# Patient Record
Sex: Male | Born: 1960 | Race: White | Hispanic: No | Marital: Married | Smoking: Former smoker
Health system: Southern US, Community
[De-identification: ages and names within clinical notes are randomized; demographics above are authoritative.]

## PROBLEM LIST (undated history)

## (undated) DIAGNOSIS — C61 Malignant neoplasm of prostate: Secondary | ICD-10-CM

## (undated) DIAGNOSIS — E785 Hyperlipidemia, unspecified: Secondary | ICD-10-CM

## (undated) DIAGNOSIS — I10 Essential (primary) hypertension: Secondary | ICD-10-CM

## (undated) DIAGNOSIS — I2510 Atherosclerotic heart disease of native coronary artery without angina pectoris: Secondary | ICD-10-CM

## (undated) DIAGNOSIS — K219 Gastro-esophageal reflux disease without esophagitis: Secondary | ICD-10-CM

## (undated) HISTORY — PX: CARDIAC CATHETERIZATION: SHX172

## (undated) HISTORY — PX: PROSTATE BIOPSY: SHX241

## (undated) HISTORY — PX: LAPAROSCOPIC CHOLECYSTECTOMY: SUR755

---

## 2007-03-28 ENCOUNTER — Inpatient Hospital Stay (HOSPITAL_COMMUNITY): Admission: AD | Admit: 2007-03-28 | Discharge: 2007-03-30 | Payer: Self-pay | Admitting: Cardiology

## 2007-03-28 ENCOUNTER — Encounter: Payer: Self-pay | Admitting: Emergency Medicine

## 2007-03-29 ENCOUNTER — Encounter (INDEPENDENT_AMBULATORY_CARE_PROVIDER_SITE_OTHER): Payer: Self-pay | Admitting: Cardiology

## 2007-04-06 ENCOUNTER — Ambulatory Visit: Payer: Self-pay | Admitting: Cardiology

## 2007-04-06 ENCOUNTER — Emergency Department (HOSPITAL_COMMUNITY): Admission: EM | Admit: 2007-04-06 | Discharge: 2007-04-06 | Payer: Self-pay | Admitting: Emergency Medicine

## 2007-06-26 ENCOUNTER — Encounter: Admission: RE | Admit: 2007-06-26 | Discharge: 2007-06-26 | Payer: Self-pay | Admitting: Gastroenterology

## 2007-10-03 ENCOUNTER — Ambulatory Visit (HOSPITAL_COMMUNITY): Admission: RE | Admit: 2007-10-03 | Discharge: 2007-10-04 | Payer: Self-pay | Admitting: General Surgery

## 2007-10-03 ENCOUNTER — Encounter (INDEPENDENT_AMBULATORY_CARE_PROVIDER_SITE_OTHER): Payer: Self-pay | Admitting: General Surgery

## 2008-05-20 ENCOUNTER — Emergency Department (HOSPITAL_COMMUNITY): Admission: EM | Admit: 2008-05-20 | Discharge: 2008-05-20 | Payer: Self-pay | Admitting: *Deleted

## 2009-03-10 IMAGING — US US ABDOMEN COMPLETE
1 series · 13 of 25 positions shown · non-contrast
Comparison: none

CLINICAL DATA: Abdominal pain and nausea.  Chest pain. 
 ABDOMEN ULTRASOUND:
TECHNIQUE: Complete abdominal ultrasound examination was performed including evaluation of the liver, gallbladder, bile ducts, pancreas, kidneys, spleen, IVC, and abdominal aorta.

[Series 1: us abdomen complete · 0.29mm/px · 13 of 99 slices shown]
[im 1/99]
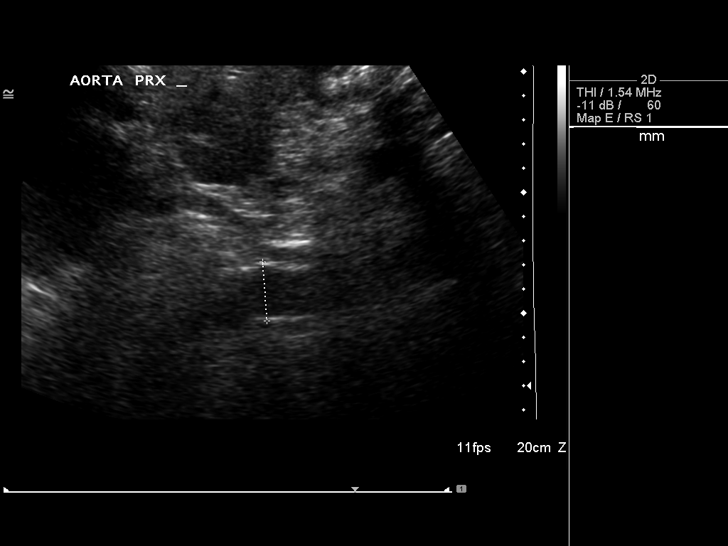
[im 9/99]
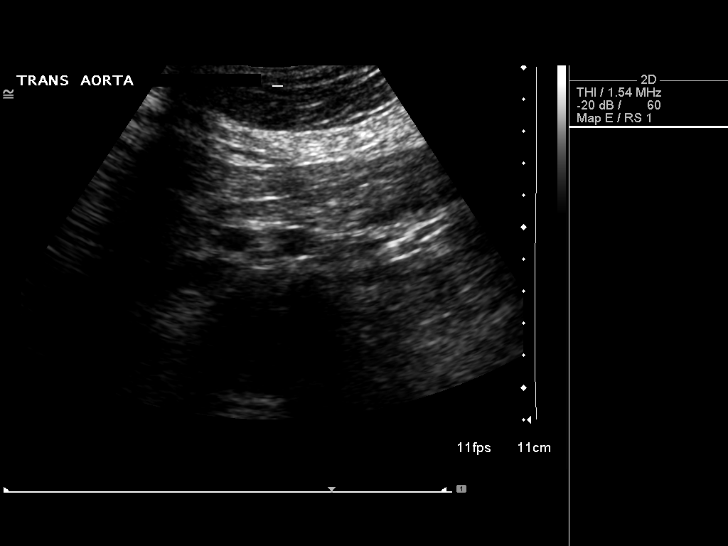
[im 17/99]
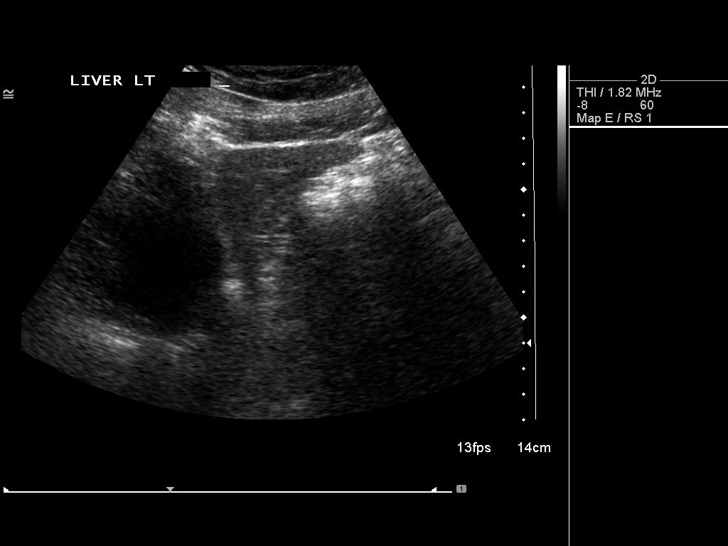
[im 25/99]
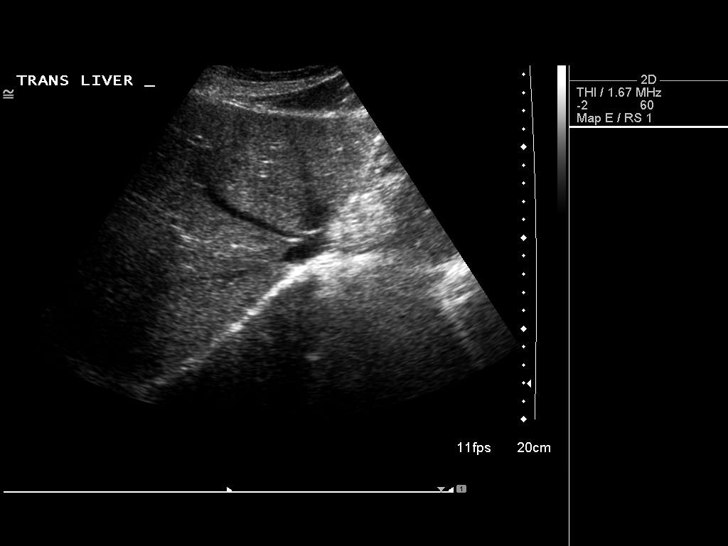
[im 33/99]
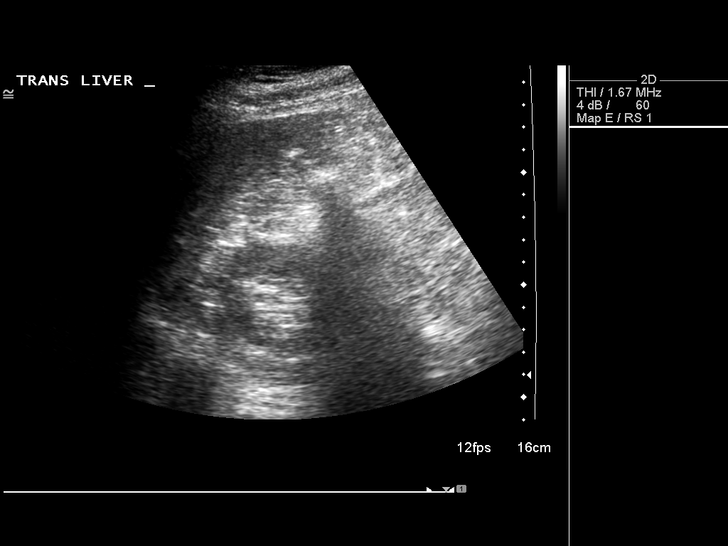
[im 41/99]
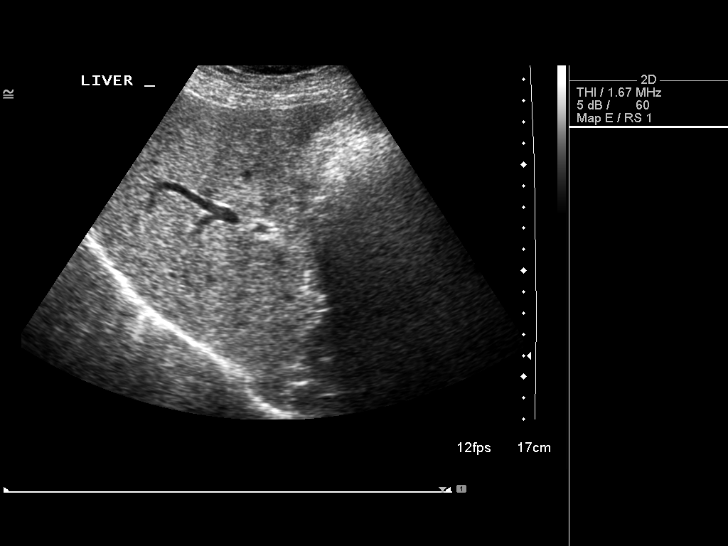
[im 50/99]
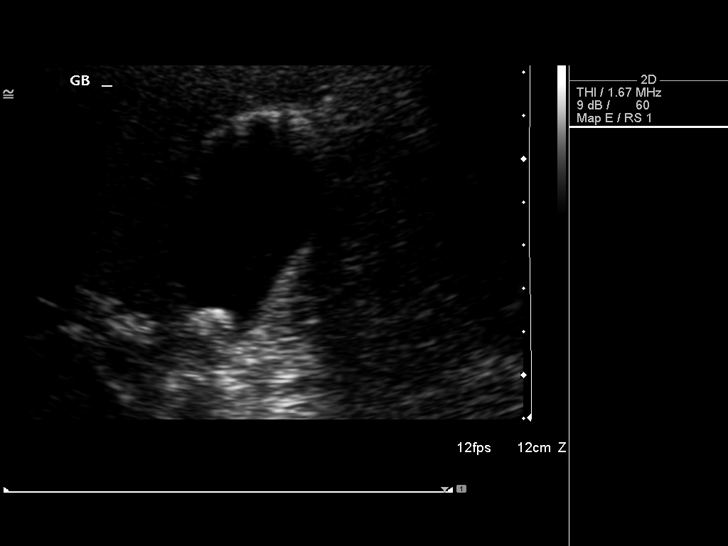
[im 58/99]
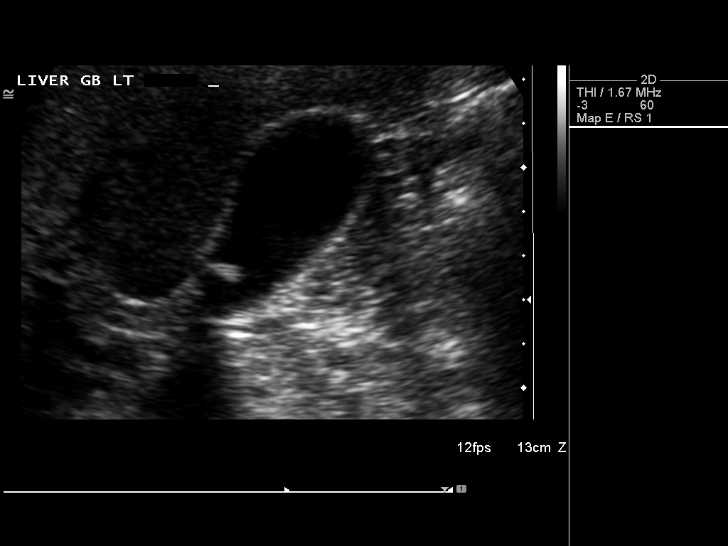
[im 66/99]
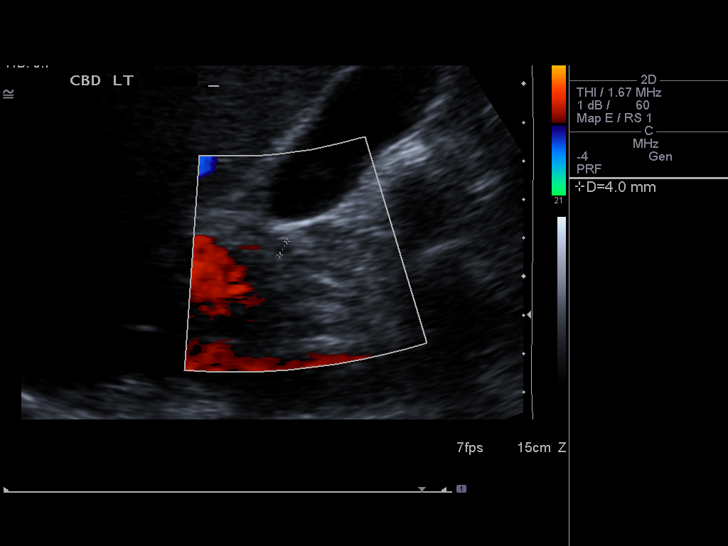
[im 74/99]
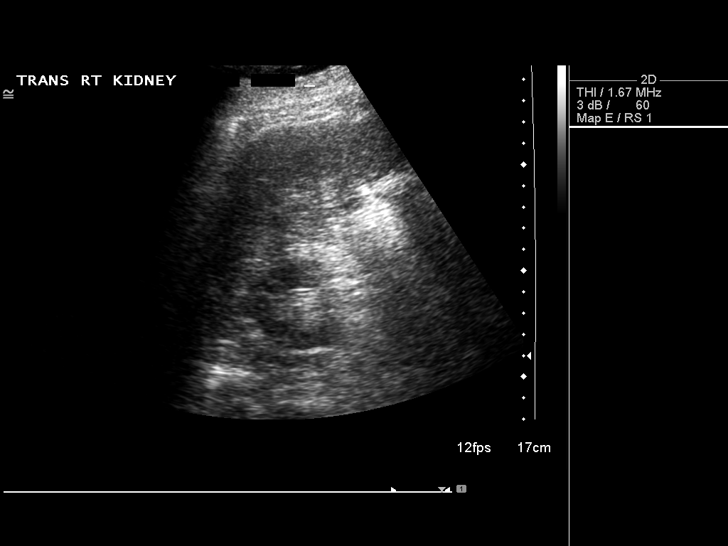
[im 82/99]
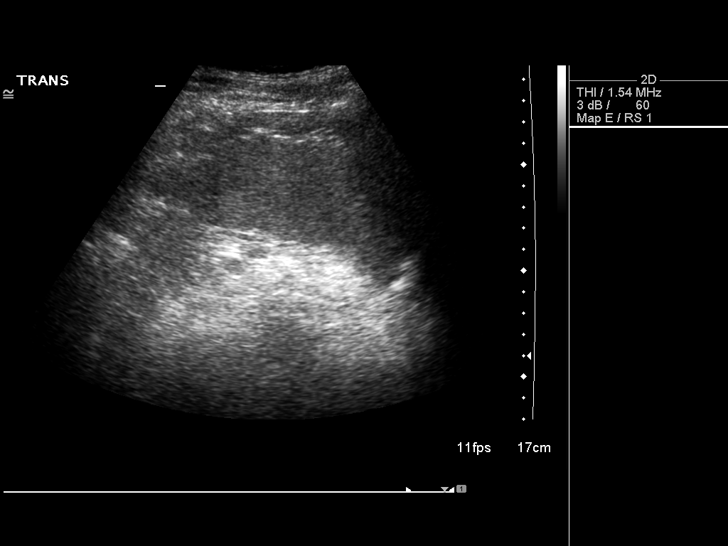
[im 90/99]
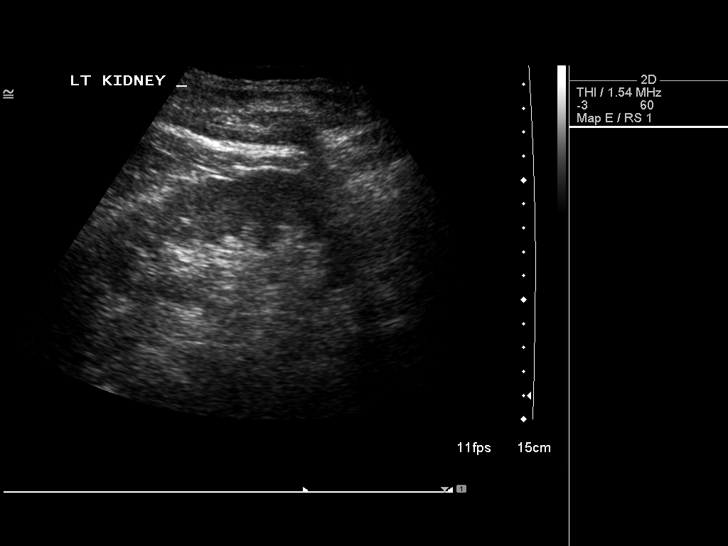
[im 99/99]
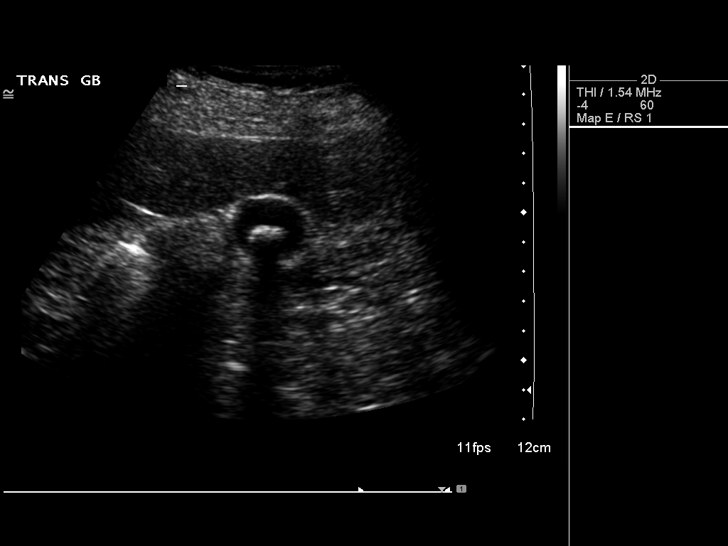

[13 of 25 positions shown; findings below may reference images not displayed]

FINDINGS: There is a single mobile 13 mm stone in the gallbladder as well as what appear to be two tiny gallbladder polyps.  There is also some increased echogenicity from the fundus consistent with adenomyomatosis.  The common bile duct is normal at 4 mm in diameter.  No dilated intrahepatic bile ducts.  There is diffuse slight increased echogenicity of the liver parenchyma consistent with fatty infiltration.  The inferior vena cava, spleen, kidneys, and aorta are normal.  The head and body of the pancreas are normal.  The tail of the pancreas is obscured by bowel gas.  Right kidney is 11.7 cm in length and left kidney is 10.8 cm in length.  The spleen is 12.7 cm in maximum diameter.  The maximum diameter of the abdominal aorta is 2.5 cm.
IMPRESSION: 1.  Single, mobile gallstone.  Two small gallbladder polyps as well as adenomyomatosis of the fundus of the gallbladder. 
 2.  Fatty liver.  
 3.  Nonvisualization of the tail of the pancreas.
 4.  Otherwise normal exam.

## 2009-06-17 IMAGING — RF DG CHOLANGIOGRAM OPERATIVE
1 series · 4 of 4 positions shown · non-contrast
Comparison: None

CLINICAL DATA: Gallstones

INTRAOPERATIVE CHOLANGIOGRAM
TECHNIQUE: Multiple fluoroscopic spot radiographs were obtained
during intraoperative cholangiogram and are submitted for
interpretation post-operatively.

[Series 1: run · 4 of 32 frames shown]
[frame 5/32]
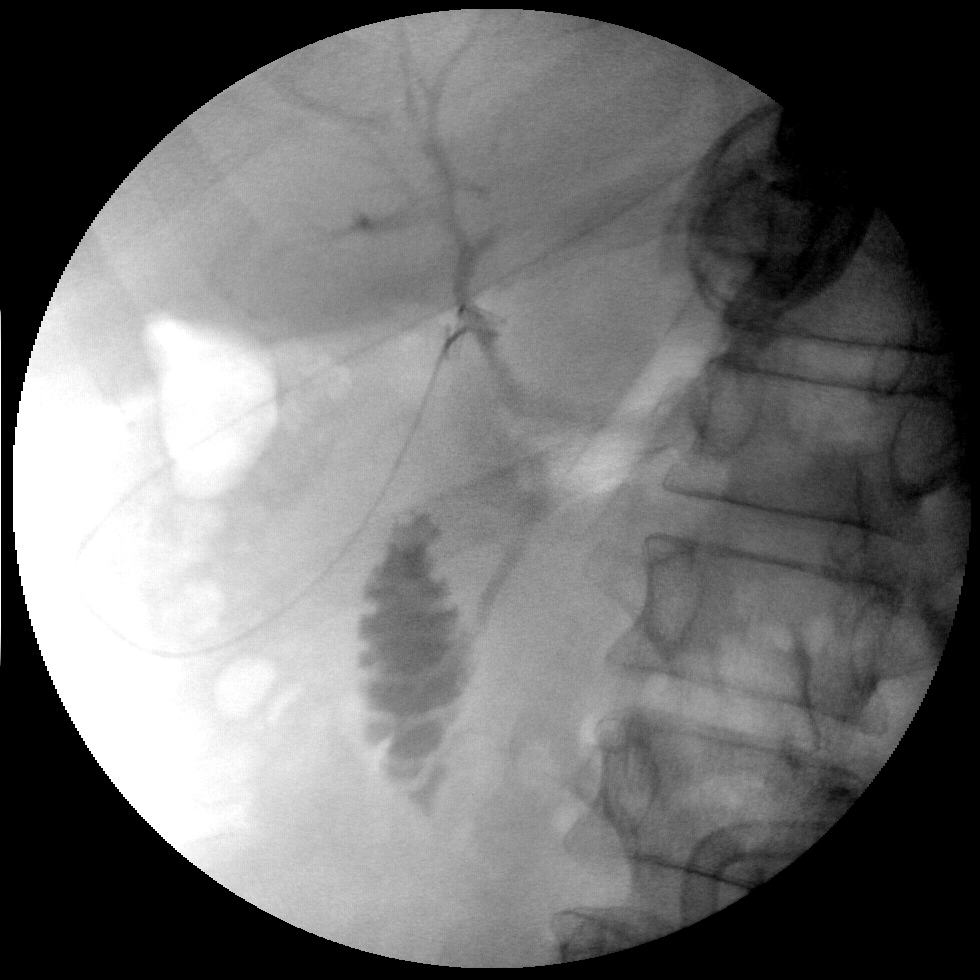
[frame 13/32]
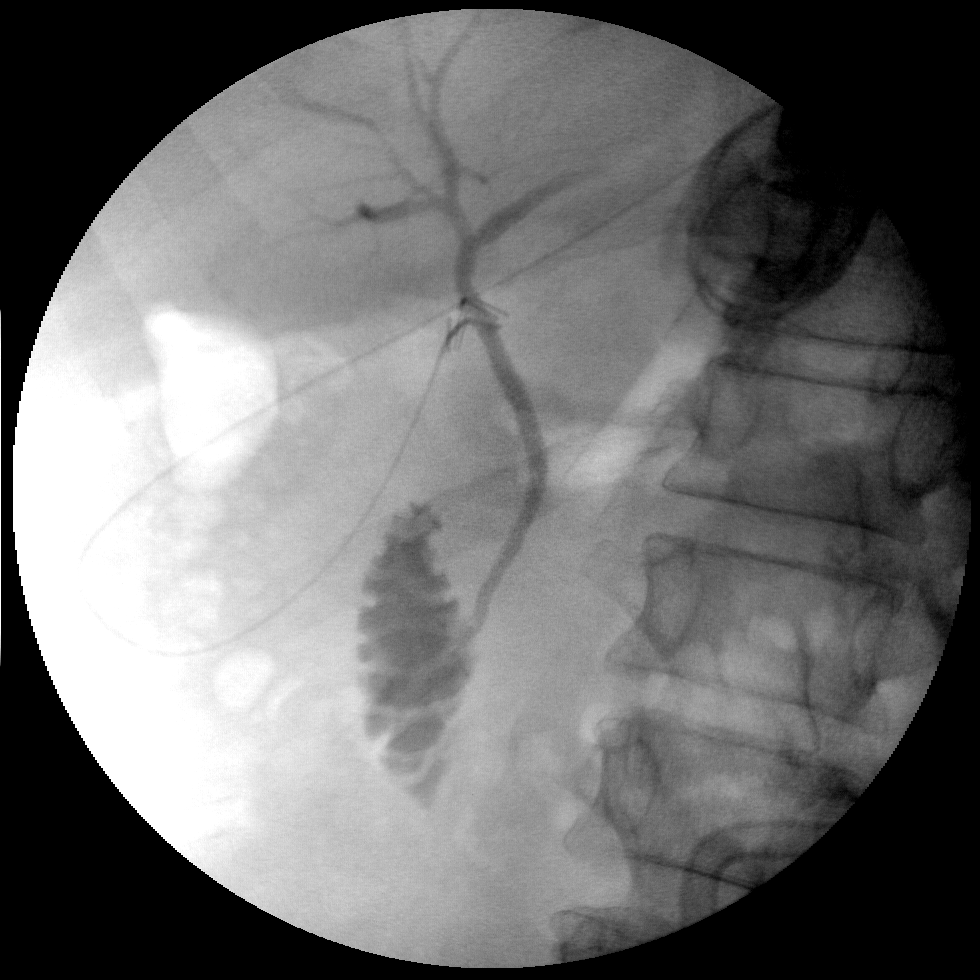
[frame 17/32]
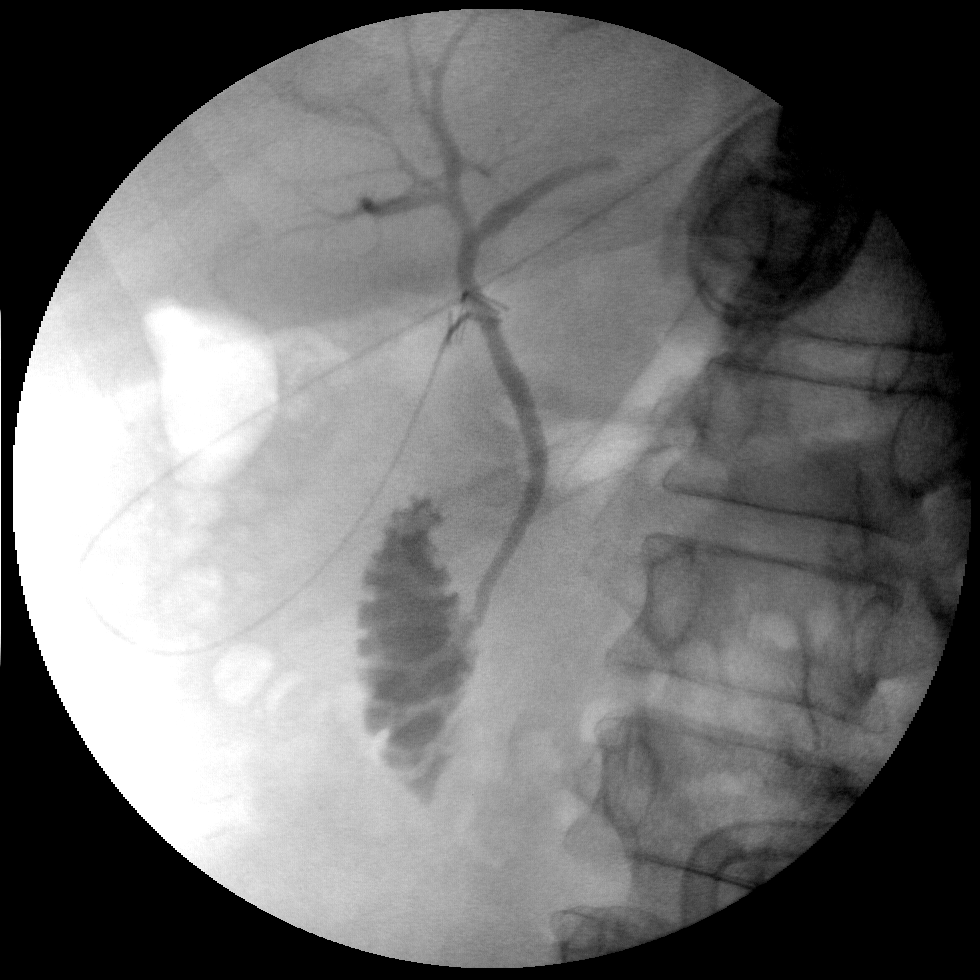
[frame 28/32]
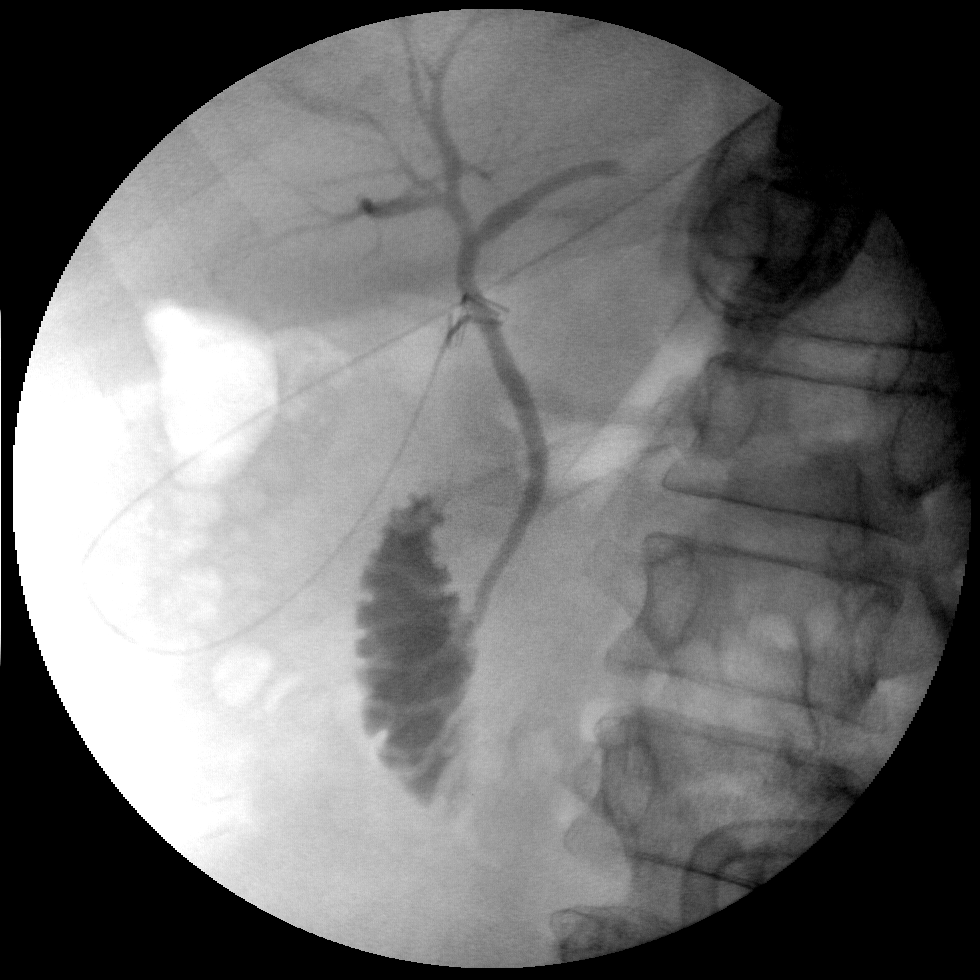

[4 of 4 positions shown; findings below may reference images not displayed]

FINDINGS: The biliary tree is well distended with contrast.  There
is no evidence of a filling defect or retained calculus.  There is
flow of contrast into the duodenum with no evidence of obstruction.
IMPRESSION: Normal examination.  No evidence of retained calculus or
obstruction.

## 2010-08-29 LAB — POCT I-STAT, CHEM 8
Glucose, Bld: 102 mg/dL — ABNORMAL HIGH (ref 70–99)
TCO2: 28 mmol/L (ref 0–100)

## 2010-08-29 LAB — POCT CARDIAC MARKERS
CKMB, poc: <1 ng/mL — ABNORMAL LOW (ref 1.0–8.0)
Myoglobin, poc: 51.2 ng/mL (ref 12–200)

## 2010-08-29 LAB — D-DIMER, QUANTITATIVE: D-Dimer, Quant: <0.22 ug/mL-FEU (ref 0.00–0.48)

## 2010-09-27 NOTE — Cardiovascular Report (Signed)
NAMELIEF, PALMATIER                ACCOUNT NO.:  [id number]   MEDICAL RECORD NO.:  [id number]          PATIENT TYPE:  INP   LOCATION:  6527                         FACILITY:  [facility]   PHYSICIAN:  Francisca December, M.D.  DATE OF BIRTH:  Nov 10, 1960   DATE OF PROCEDURE:  03/29/2007  DATE OF DISCHARGE:  03/30/2007                            CARDIAC CATHETERIZATION   PROCEDURES PERFORMED:  1. Left heart catheterization.  2. Coronary angiography.  3. Left ventriculogram.   INDICATIONS:  [name] is a 50 year old man who yesterday  developed unstable anginal chest pain syndrome.  He presented to [facility] with chest discomfort and ST-segment depression with T-  wave inversion in the inferior and lateral leads.  Once his discomfort  had resolved after two sublingual nitroglycerin, the EKG changes  resolved as well.  He did have some symptoms of flushing and  lightheadedness as well as the onset of chest discomfort with eating.  This recurred this morning at breakfast.  He has ruled out for  myocardial infarction by cardiac enzymes.  The ECG from earlier today is  not in the chart.  He has been treated with subcutaneous Lovenox,  aspirin and Integrilin.  He is brought to catheterization laboratory at  this time to identify the extent of disease with the diagnosis of  unstable angina pectoris and provide for further therapeutic options.   PROCEDURAL NOTE:  The patient brought to cardiac catheterization  laboratory in the fasting state.  The right groin was prepped and draped  in the usual sterile fashion.  Local anesthesia was obtained with  infiltration of 1% lidocaine.  A 6-French catheter sheath was inserted  percutaneously into the right femoral artery utilizing an anterior  approach over a guiding J-wire.  Left heart catheterization and coronary  angiography then proceed in the standard fashion using 6-French #4 left  and right Judkins catheters and a 110-cm pigtail  catheter.  A 30 degree  RAO cine left ventriculogram was performed utilizing a power injector.  24 mL were injected at 13 mL per second.  At the completion of  procedure, a right femoral arteriogram in the 45 degree RAO angulation  via the catheter sheath by hand injection documented adequate anatomy  for placement of the percutaneous closure device Angio-Seal.  There was  good hemostasis after the insertion, but a hematoma had developed during  the case, a small hematoma.  Pressures is being held currently for up to  15 minutes.   The patient was subsequent transported to the recovery area in stable  condition.   HEMODYNAMICS:  Systemic arterial pressure was 102/76 with a mean of 86  mmHg.  There was no systolic gradient across the aortic valve.  The left  ventricular end-diastolic pressure was 15-19 mmHg pre ventriculogram.   ANGIOGRAPHY:  The left ventriculogram was complicated by the presence of  PVCs.  However, there did appear to be hyperdynamic LV systolic function  with near cavity obliteration.  No regional wall motion abnormalities  are noted.  A visual estimate of the ejection fraction is 80%.  There  is  no coronary calcification seen.  The aortic valve is trileaflet and  opens normally during systole.  There is what appeared to be 2 to 3+  mitral regurgitation but this was a post PVC beat.   There is a right-dominant coronary system present.  The main left  coronary is long and normal.   The left anterior descending artery and its branches show some luminal  irregularities but no significant obstructions are seen within the body  of the anterior descending artery.  A first diagonal branch arises very  proximally and in the initial LAO cranial view appeared to contain some  stenosis; however, subsequent angiography revealed only a 30% stenosis  in the proximal segment of this large diagonal.   The left circumflex coronary artery is again without significant   obstruction.  The initial LAO cranial angiogram again suggested the  presence of at least a 50% stenosis in the circumflex; however, this  again is not seen in other angulations.  There is a small ramus  intermedius without significant obstruction and the ongoing circumflex  then gives rise to two marginal branches which are small to moderate in  size.   The right coronary artery and its branches is large and dominant.  There  are luminal irregularities with plaque formation in the midportion of  the right coronary.  The distal vessel gives rise to a small moderate  posterior descending artery, a moderate-to-large size posterolateral  segment and two left ventricular branches the first of which is moderate  and the second of which is large.  There are no obstructions in these  distal vessels.   FINAL IMPRESSION:  1. Atherosclerotic plaquing in the left anterior descending artery and      the right coronary is seen with no significant obstruction.  2. Hyperdynamic left ventricular systolic function with a question of      moderate mitral regurgitation.  3. Mildly elevated left ventricular end-diastolic pressure.  This      argues against the possibility of dehydration and hypovolemia as      etiology for his hyperdynamic left ventricle.   PLAN:  1. Will observe overnight due to the presence of the small hematoma,      allow the Lovenox and Integrilin to dissipate.  2. Increase IV fluids.  3. Increase beta blocker.  4. Begin Pepcid and Protonix.      Francisca December, M.D.  Electronically Signed     JHE/MEDQ  D:  03/29/2007  T:  03/30/2007  Job:  [id number]

## 2010-09-27 NOTE — Op Note (Signed)
NAMEHERNANDO, [name]                ACCOUNT NO.:  [id number]   MEDICAL RECORD NO.:  [id number]          PATIENT TYPE:  AMB   LOCATION:  DAY                          FACILITY:  [facility]   PHYSICIAN:  Anselm Pancoast. Weatherly, M.D.DATE OF BIRTH:  1960-12-25   DATE OF PROCEDURE:  10/03/2007  DATE OF DISCHARGE:                               OPERATIVE REPORT   PREOPERATIVE DIAGNOSIS:  Chronic cholecystitis with stones.   POSTOPERATIVE DIAGNOSIS:  Chronic cholecystitis with stones.   OPERATION:  Laparoscopic cholecystectomy with cholangiogram.   HISTORY:  [name] is a 50 year old Caucasian male who was referred  to me approximately 2 months ago.  He had had episodes of epigastric  pain. He has a history of hypertension maintained on hypertensive  medication and has had intermittent episodes of back pain which was  thought __________ .  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  There were 2 small gallbladder polyps as  well as __________ changes __________ gallbladder, sort of a fatty  liver, and I was asked to see the patient, and on his episodes of pain  which are usually epigastric, sometimes will radiate to the chest and  back, I really wondered if could be possibly be passing small  gallstones.  His liver function studies preoperatively were perfectly  normal with OT and PT of 21 and 22 and alkaline phosphatase of 69, total  bilirubin of 1.1.  Preoperatively, he has PAS stockings identified.  There has been no change in his history and physical.  I saw him in the  office about 6 weeks ago with exception he had had 2 episodes of pain  fairly typical for him that did last about 30 minutes.  I gave him 3 g  of Unasyn, and he had taken his blood pressure medicines in the morning.  He was taken back to the operative  suite.  Induction of general  anesthesia.  The abdomen was prepped Betadine solution after clipping  around the navel and then draped in a sterile manner.  A small incision  was made below the umbilicus down to about 2 cm of adipose tissue.  The  fascia was identified, and a small vertical incision was made, and then  we carefully entered into the peritoneal cavity.  A pursestring suture  of 0 Vicryl was placed, and then I also placed a single stitch right in  the middle that was not clamped that later be tied to kind of reinforce  the little incision.  The Hassan cannula was introduced, and then the  carbon dioxide was used.  The gallbladder was not acutely inflamed.  There were a few adhesions around it.  Dr. Corliss Skains had scrubbed in, and  the upper 10 mL trocar was placed under direct vision after  anesthetizing the fascia with the Marcaine with adrenaline.  The two  lateral 5-mm trocars were placed by Dr. Corliss Skains, and  then the gallbladder  was retracted upward, and he had a lot of fatty tissue around the  proximal portion of gallbladder, and this was carefully dissected down  through identifying the junction of the cystic duct and gallbladder.  The little cystic artery was separated, and this was doubly clipped  proximally, singly, distally, and divided __________.  I placed a clip  across the junction of the cystic duct gallbladder with an opening made  just proximal, the Cook catheter introduced and held in place with a  clip.  X-ray was obtained.  At first, the new C-arm would do kind of  spot films.  It was not really continuous flow, and we reprogrammed and  repeated, and this time, good flow of dye into the duodenum, do not see  any obvious evidence of stones.  The cystic duct catheter was removed  and triply clipped the cystic duct, divided it, and then freed the  gallbladder from its bed with hook electrocautery.  The gallbladder was  placed in an EndoCatch bag and then brought out.   The pursestring  sutures at the umbilicus, and the single stitch was tied.  I also put an  extra figure-of-eight in the fascia and then anesthetized the fascia at  the umbilicus and the other ports.  I then anesthetized with insertion  of the trocars.  The subcutaneous wounds were closed with 4-0 Vicryl,  Benzoin and Steri-Strips on skin.  I opened the gallbladder on the back  table.  There were a lot of little small, about 1-2 mm stones or so, and  then the bigger stone, and I expect that he had been  passing these little stones in these intermittent episodes of pain  radiating to the bacteria.  Hopefully, those symptoms will be completely  resolved.  He had had a cardiac evaluation for one of these episodes  back in November of 2008, and at that particular time, there was not any  liver function studies checked that I could find.           ______________________________  Anselm Pancoast. Zachery Dakins, M.D.     WJW/MEDQ  D:  10/03/2007  T:  10/03/2007  Job:  [id number]   cc:   Anselm Pancoast. Zachery Dakins, M.D.  [address]  [place]  [place] [address]   Everardo All. Madilyn Fireman, M.D.  Fax: [phone]

## 2011-02-08 LAB — COMPREHENSIVE METABOLIC PANEL
AST: 21
Albumin: 3.9
Alkaline Phosphatase: 69
CO2: 31
Chloride: 103
Creatinine, Ser: 1.09
GFR calc Af Amer: >60
GFR calc non Af Amer: >60
Sodium: 139
Total Bilirubin: 1.1

## 2011-02-08 LAB — CBC
HCT: 44.8
Hemoglobin: 15.4
MCV: 88.9
Platelets: 212
WBC: 9.7

## 2011-02-08 LAB — DIFFERENTIAL
Eosinophils Absolute: 0.1
Lymphocytes Relative: 27

## 2011-02-21 LAB — BASIC METABOLIC PANEL
BUN: 14
Calcium: 9
Creatinine, Ser: 0.97
GFR calc non Af Amer: >60
Potassium: 4

## 2011-02-21 LAB — COMPREHENSIVE METABOLIC PANEL
ALT: 29
Albumin: 4
Alkaline Phosphatase: 71
BUN: 18
CO2: 27
Calcium: 9.4
Potassium: 3.2 — ABNORMAL LOW
Sodium: 138
Total Bilirubin: 1.1

## 2011-02-21 LAB — DIFFERENTIAL
Basophils Absolute: 0.1
Basophils Relative: 1
Lymphocytes Relative: 25
Lymphocytes Relative: 39
Lymphs Abs: 3.8
Monocytes Absolute: 0.6
Monocytes Absolute: 0.7
Monocytes Relative: 7
Neutrophils Relative %: 69
Neutrophils Relative %: 52

## 2011-02-21 LAB — CBC
HCT: 43
HCT: 39.6
Hemoglobin: 14.6
Hemoglobin: 14.7
Hemoglobin: 14.9
MCHC: 33.9
MCHC: 34.6
MCHC: 34.4
MCV: 88.9
MCV: 87.4
Platelets: 190
Platelets: 206
RBC: 4.79
RBC: 4.8
RBC: 4.88
RDW: 12.6
RDW: 13
WBC: 12.3 — ABNORMAL HIGH
WBC: 9.7

## 2011-02-21 LAB — I-STAT 8, (EC8 V) (CONVERTED LAB)
Acid-Base Excess: 2
pCO2, Ven: 34.4 — ABNORMAL LOW
pH, Ven: 7.472 — ABNORMAL HIGH

## 2011-02-21 LAB — CK TOTAL AND CKMB (NOT AT ARMC)
CK, MB: 1.5
Relative Index: INVALID
Total CK: 109

## 2011-02-21 LAB — PROTIME-INR: Prothrombin Time: 14

## 2011-02-21 LAB — TROPONIN I
Troponin I: 0.01
Troponin I: 0.02

## 2011-02-21 LAB — POCT CARDIAC MARKERS
Myoglobin, poc: 97.7
Troponin i, poc: <0.05

## 2017-12-12 ENCOUNTER — Encounter: Payer: Self-pay | Admitting: Radiation Oncology

## 2017-12-14 ENCOUNTER — Encounter: Payer: Self-pay | Admitting: Radiation Oncology

## 2017-12-14 NOTE — Progress Notes (Signed)
GU Location of Tumor / Histology: prostatic adenocarcinoma  If Prostate Cancer, Gleason Score is (3 + 3) and PSA is (4.85). Prostate volume: 45 mL.  [name] was referred by Dr. Donah Driver to Dr. Jeffie Pollock in May 2019 for further evaluation of an elevated PSA.   Biopsies of prostate (if applicable) revealed:    Past/Anticipated interventions by urology, if any: prostate biopsy, referral to radiation oncology for consideration of brachytherapy  Past/Anticipated interventions by medical oncology, if any: no  Weight changes, if any: no  Bowel/Bladder complaints, if any: IPSS 4. SHIM 25. Denies dysuria, hematuria, urinary leakage or incontinence.  Stream is slightly weaker than it used to be.   Nausea/Vomiting, if any: no  Pain issues, if any:  no  SAFETY ISSUES:  Prior radiation? no  Pacemaker/ICD? no  Possible current pregnancy? no  Is the patient on methotrexate? no  Current Complaints / other details:  57 year old male. 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 Adopted by aunt. Paternal family hx unknown. Maternal uncle now deceased had hx of prostate ca. Reports he has two cousins with breast cancer.

## 2017-12-17 ENCOUNTER — Encounter: Payer: Self-pay | Admitting: Medical Oncology

## 2017-12-17 ENCOUNTER — Other Ambulatory Visit: Payer: Self-pay

## 2017-12-17 ENCOUNTER — Telehealth: Payer: Self-pay | Admitting: *Deleted

## 2017-12-17 ENCOUNTER — Ambulatory Visit
Admission: RE | Admit: 2017-12-17 | Discharge: 2017-12-17 | Disposition: A | Discharge status: Home / Self Care | Payer: No Typology Code available for payment source | Source: Ambulatory Visit | Attending: Radiation Oncology | Admitting: Radiation Oncology

## 2017-12-17 ENCOUNTER — Encounter: Payer: Self-pay | Admitting: Radiation Oncology

## 2017-12-17 ENCOUNTER — Other Ambulatory Visit: Payer: Self-pay | Admitting: Urology

## 2017-12-17 VITALS — BP 140/86 | HR 67 | Temp 98.0°F | Resp 18 | Ht 72.0 in | Wt 256.0 lb

## 2017-12-17 DIAGNOSIS — C61 Malignant neoplasm of prostate: Secondary | ICD-10-CM | POA: Insufficient documentation

## 2017-12-17 DIAGNOSIS — Z79899 Other long term (current) drug therapy: Secondary | ICD-10-CM | POA: Insufficient documentation

## 2017-12-17 DIAGNOSIS — Z87891 Personal history of nicotine dependence: Secondary | ICD-10-CM | POA: Diagnosis not present

## 2017-12-17 DIAGNOSIS — Z809 Family history of malignant neoplasm, unspecified: Secondary | ICD-10-CM | POA: Insufficient documentation

## 2017-12-17 HISTORY — DX: Malignant neoplasm of prostate: C61

## 2017-12-17 NOTE — Telephone Encounter (Signed)
Called patient to inform of pre-seed planning CT and his implant, lvm for a return call 

## 2017-12-17 NOTE — Progress Notes (Signed)
See progress note under physician encounter. 

## 2017-12-17 NOTE — Progress Notes (Signed)
Introduced myself to Mr. Hagy and his wife as the prostate nurse navigator and my role. He has done research on treatment options and is very interested in brachytherapy. I gave them my card and asked them to call with questions or concerns.

## 2017-12-17 NOTE — Progress Notes (Signed)
Radiation Oncology         [phone] ________________________________  Initial Outpatient Consultation  Name: [name] MRN: [id number]  Date: 12/17/2017  DOB: 1960-08-05  [id number], Dibas, MD  Irine Seal, MD   REFERRING PHYSICIAN: Irine Seal, MD  DIAGNOSIS: 57 y.o. gentleman with low risk, Stage T1c adenocarcinoma of the prostate with Gleason Score of 3+3, and PSA of 4.85    ICD-10-CM   1. Malignant neoplasm of prostate ([place]) [place] Ambulatory Referral to Genetics  2. Family history of cancer Z80.9 Ambulatory Referral to Genetics    HISTORY OF PRESENT ILLNESS: [name] is a 56 y.o. male with a diagnosis of prostate cancer.  He was noted to have an elevated PSA of 4.55 in March 2019 by his primary care physician, Dr. Lujean Amel.  It had previously been 3.75 in September 2018. Accordingly, he was referred for evaluation in urology to Dr. Jeffie Pollock on 09/19/17, where a digital rectal examination was performed at that time revealing no prostate nodules.  His PSA was repeated in June 2019 and had risen to 4.85.  The patient proceeded to transrectal ultrasound with 12 biopsies of the prostate on 11/12/17.  On ultrasound, there was a hypoechoic lesion in the left mid transition zone, about 8 mm, and a few central stones were seen along the ejaculatory ducts. The prostate volume measured 45 cc.  Out of 12 core biopsies, 6 were positive.  The maximum Gleason score was 3+3, and this was seen in the left mid lateral, left apex lateral, left mid, left apex, right apex, and right apex lateral.    Biopsies of prostate revealed:    The patient reviewed the biopsy results with his urologist and he has kindly been referred today for discussion of potential radiation treatment options.   PREVIOUS RADIATION THERAPY: No  PAST MEDICAL HISTORY:  Past Medical History:  Diagnosis Date  . Prostate cancer ([place])       PAST SURGICAL HISTORY: Past Surgical History:  Procedure Laterality Date  .  CHOLECYSTECTOMY, LAPAROSCOPIC    . PROSTATE BIOPSY      FAMILY HISTORY: Adopted by maternal aunt. Paternal family history unknown. Family History  Adopted: Yes  Problem Relation Age of Onset  . Prostate cancer Maternal Uncle   . Breast cancer Cousin   . Breast cancer Cousin     SOCIAL HISTORY:  Social History   Socioeconomic History  . Marital status: Married    Spouse name: Not on file  . Number of children: 1  . Years of education: Not on file  . Highest education level: Not on file  Occupational History    Comment: grocery manager/full time  Social Needs  . Financial resource strain: Not on file  . Food insecurity:    Worry: Not on file    Inability: Not on file  . Transportation needs:    Medical: Not on file    Non-medical: Not on file  Tobacco Use  . Smoking status: Former Smoker    Packs/day: 1.50    Years: 25.00    Pack years: 37.50    Types: Cigarettes    Last attempt to quit: 08/28/2003    Years since quitting: 14.3  . Smokeless tobacco: Never Used  Substance and Sexual Activity  . Alcohol use: Never    Frequency: Never  . Drug use: Never  . Sexual activity: Not on file  Lifestyle  . Physical activity:    Days per week: Not on file  Minutes per session: Not on file  . Stress: Not on file  Relationships  . Social connections:    Talks on phone: Not on file    Gets together: Not on file    Attends religious service: Not on file    Active member of club or organization: Not on file    Attends meetings of clubs or organizations: Not on file    Relationship status: Not on file  . Intimate partner violence:    Fear of current or ex partner: Not on file    Emotionally abused: Not on file    Physically abused: Not on file    Forced sexual activity: Not on file  Other Topics Concern  . Not on file  Social History Narrative   Resides in [place] with wife. Has one daughter. 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 Adopted by maternal  aunt.    ALLERGIES: Patient has no known allergies.  MEDICATIONS:  Current Outpatient Medications  Medication Sig Dispense Refill  . atorvastatin (LIPITOR) 20 MG tablet Take 20 mg by mouth daily.    . citalopram (CELEXA) 40 MG tablet Take 40 mg by mouth daily.    . metoprolol tartrate (LOPRESSOR) 50 MG tablet Take 50 mg by mouth daily.     Marland Kitchen omeprazole (PRILOSEC) 20 MG capsule Take 20 mg by mouth daily.     No current facility-administered medications for this encounter.     REVIEW OF SYSTEMS:  On review of systems, the patient reports that he is doing well overall. He denies any chest pain, shortness of breath, cough, fevers, chills, night sweats, or unintended weight changes. He denies any bowel disturbances, and denies abdominal pain, nausea or vomiting. He denies any new musculoskeletal or joint aches or pains. His IPSS was 4, indicating mild urinary symptoms. His urine stream is slightly weaker than it used to be. He denies dysuria, hematuria, urinary leakage or incontinence. His SHIM score is 25. He is able to complete sexual activity with all attempts. A complete review of systems is obtained and is otherwise negative.    PHYSICAL EXAM:  Wt Readings from Last 3 Encounters:  12/17/17 256 lb (116.1 kg)   Temp Readings from Last 3 Encounters:  12/17/17 98 F (36.7 C) (Oral)   BP Readings from Last 3 Encounters:  12/17/17 140/86   Pulse Readings from Last 3 Encounters:  12/17/17 67   Pain Assessment Pain Score: 0-No pain/10  In general this is a well appearing caucasian male in no acute distress. He's alert and oriented x4 and appropriate throughout the examination. Cardiopulmonary assessment is negative for acute distress and he exhibits normal effort.    KPS = 100  100 - Normal; no complaints; no evidence of disease. 90   - Able to carry on normal activity; minor signs or symptoms of disease. 80   - Normal activity with effort; some signs or symptoms of disease. 37    - Cares for self; unable to carry on normal activity or to do active work. 60   - Requires occasional assistance, but is able to care for most of his personal needs. 50   - Requires considerable assistance and frequent medical care. 76   - Disabled; requires special care and assistance. 52   - Severely disabled; hospital admission is indicated although death not imminent. 73   - Very sick; hospital admission necessary; active supportive treatment necessary. 10   - Moribund; fatal processes progressing rapidly. 0     -  Dead  Karnofsky DA, Abelmann [place], Craver LS and Burchenal [facility] [phone]) The use of the nitrogen mustards in the palliative treatment of carcinoma: with particular reference to bronchogenic carcinoma Cancer 1 634-56  LABORATORY DATA:  Lab Results  Component Value Date   WBC 9.7 10/01/2007   HGB 18.0 (H) 05/20/2008   HCT 53.0 (H) 05/20/2008   MCV 88.9 10/01/2007   PLT 212 10/01/2007   Lab Results  Component Value Date   NA 141 05/20/2008   K 4.2 05/20/2008   CL 104 05/20/2008   CO2 31 10/01/2007   Lab Results  Component Value Date   ALT 21 10/01/2007   AST 21 10/01/2007   ALKPHOS 69 10/01/2007   BILITOT 1.1 10/01/2007     RADIOGRAPHY: No results found.    IMPRESSION/PLAN: 1. 57 y.o. gentleman with low risk, Stage T1c adenocarcinoma of the prostate with Gleason Score of 3+3, and PSA of 4.85. We discussed the patient's workup and outlined the nature of prostate cancer in this setting. The patient's T stage, Gleason's score, and PSA put him into the low risk group. Accordingly, he is eligible for a variety of potential treatment options including prostatectomy, brachytherapy, or 5-1/2 weeks of external radiation. We discussed the available radiation techniques, and focused on the details and logistics and delivery. We discussed and outlined the risks, benefits, short and long-term effects associated with radiotherapy and compared and contrasted these with prostatectomy. We  also discussed the role of SpaceOAR in reducing the rectal toxicity associated with radiotherapy. At the conclusion of our conversation, the patient is interested in moving forward with brachytherapy and use of SpaceOAR to reduce rectal toxicity from radiotherapy.  We will share our discussion with Dr. Jeffie Pollock and move forward with scheduling his CT [facility] planning appointment in the near future.  The patient met briefly with Romie Jumper in our office who will be working closely with him to coordinate OR scheduling and pre and post procedure appointments.  He will have a prostate MRI following his post-seed CT SIM to confirm appropriate distribution of the [place]. 2. Possible genetic predisposition to malignancy. The patient is a candidate for genetic testing given his personal and family history. He was offered referral and is interested and a referral was placed.  In a visit lasting 60 minutes, greater than 50% of the time was spent face to face discussing his case, and coordinating the patient's care.    Carola Rhine, [facility]   Page Me  Seen with  _____________________________________  Sheral Apley Tammi Klippel, M.D.  This document serves as a record of services personally performed by Tyler Pita, MD and Shona Simpson, PA-C. It was created on their behalf by Rae Lips, a trained medical scribe. The creation of this record is based on the scribe's personal observations and the providers' statements to them. This document has been checked and approved by the attending providers.

## 2017-12-18 ENCOUNTER — Encounter: Payer: Self-pay | Admitting: Genetic Counselor

## 2017-12-18 ENCOUNTER — Telehealth: Payer: Self-pay | Admitting: Genetic Counselor

## 2017-12-18 ENCOUNTER — Telehealth: Payer: Self-pay | Admitting: *Deleted

## 2017-12-18 NOTE — Telephone Encounter (Signed)
A genetic counseling appt has been scheduled for the pt to see Roma Kayser on 8/28 at 1pm. Letter mailed to the pt.

## 2017-12-18 NOTE — Telephone Encounter (Signed)
Called patient to inform of genetic appt. for 01-09-18 @ 1 pm with Roma Kayser, lvm for a return call

## 2018-01-09 ENCOUNTER — Inpatient Hospital Stay: Payer: No Typology Code available for payment source | Admitting: Genetic Counselor

## 2018-01-09 ENCOUNTER — Telehealth: Payer: Self-pay | Admitting: Radiation Oncology

## 2018-01-09 ENCOUNTER — Inpatient Hospital Stay: Payer: No Typology Code available for payment source

## 2018-01-09 NOTE — Telephone Encounter (Signed)
Returned call to patient requesting to cancel his Genetics appt and he will call back to reschedule.  This was due to family member in the hospital today per 8/27 VM from patient

## 2018-01-17 ENCOUNTER — Telehealth: Payer: Self-pay | Admitting: *Deleted

## 2018-01-17 NOTE — Telephone Encounter (Signed)
CALLED PATIENT TO REMIND OF PRE-SEED APPTS. FOR 01-18-18, LVM FOR A RETURN CALL

## 2018-01-18 ENCOUNTER — Encounter: Payer: Self-pay | Admitting: Medical Oncology

## 2018-01-18 ENCOUNTER — Encounter (HOSPITAL_COMMUNITY)
Admission: RE | Admit: 2018-01-18 | Discharge: 2018-01-18 | Disposition: A | Discharge status: Home / Self Care | Payer: No Typology Code available for payment source | Source: Ambulatory Visit | Attending: Urology | Admitting: Urology

## 2018-01-18 ENCOUNTER — Encounter (INDEPENDENT_AMBULATORY_CARE_PROVIDER_SITE_OTHER): Payer: Self-pay

## 2018-01-18 ENCOUNTER — Ambulatory Visit
Admission: RE | Admit: 2018-01-18 | Discharge: 2018-01-18 | Disposition: A | Discharge status: Home / Self Care | Payer: No Typology Code available for payment source | Source: Ambulatory Visit | Attending: Radiation Oncology | Admitting: Radiation Oncology

## 2018-01-18 ENCOUNTER — Ambulatory Visit (HOSPITAL_COMMUNITY)
Admission: RE | Admit: 2018-01-18 | Discharge: 2018-01-18 | Disposition: A | Discharge status: Home / Self Care | Payer: No Typology Code available for payment source | Source: Ambulatory Visit | Attending: Urology | Admitting: Urology

## 2018-01-18 DIAGNOSIS — C61 Malignant neoplasm of prostate: Secondary | ICD-10-CM

## 2018-01-18 DIAGNOSIS — Z01818 Encounter for other preprocedural examination: Secondary | ICD-10-CM | POA: Diagnosis present

## 2018-01-18 DIAGNOSIS — R001 Bradycardia, unspecified: Secondary | ICD-10-CM | POA: Diagnosis not present

## 2018-01-18 NOTE — Progress Notes (Signed)
  Radiation Oncology         [phone] ________________________________  Name: [name] MRN: [id number]  Date: 01/18/2018  DOB: 08/26/1960  SIMULATION AND TREATMENT PLANNING NOTE PUBIC ARCH STUDY  [id number], Dibas, MD  Irine Seal, MD  DIAGNOSIS: 57 y.o. gentleman with low risk, Stage T1c adenocarcinoma of the prostate with Gleason Score of 3+3, and PSA of 4.85     ICD-10-CM   1. Malignant neoplasm of prostate ([place]) C61     COMPLEX SIMULATION:  The patient presented today for evaluation for possible prostate seed implant. He was brought to the radiation planning suite and placed supine on the CT couch. A 3-dimensional image study set was obtained in upload to the planning computer. There, on each axial slice, I contoured the prostate gland. Then, using three-dimensional radiation planning tools I reconstructed the prostate in view of the structures from the transperineal needle pathway to assess for possible pubic arch interference. In doing so, I did not appreciate any pubic arch interference. Also, the patient's prostate volume was estimated based on the drawn structure. The volume was 46 cc.  Given the pubic arch appearance and prostate volume, patient remains a good candidate to proceed with prostate seed implant. Today, he freely provided informed written consent to proceed.    PLAN: The patient will undergo prostate seed implant.   ________________________________  Sheral Apley. Tammi Klippel, M.D.  This document serves as a record of services personally performed by Tyler Pita, MD. It was created on his behalf by Wilburn Mylar, a trained medical scribe. The creation of this record is based on the scribe's personal observations and the provider's statements to them. This document has been checked and approved by the attending provider.

## 2018-02-07 NOTE — Progress Notes (Signed)
FMLA successfully faxed to Cleda Mccreedy at [place] at [phone]. Mailed copy to patient address on file.

## 2018-02-19 ENCOUNTER — Other Ambulatory Visit: Payer: Self-pay | Admitting: Urology

## 2018-02-19 DIAGNOSIS — C61 Malignant neoplasm of prostate: Secondary | ICD-10-CM

## 2018-02-20 ENCOUNTER — Telehealth: Payer: Self-pay | Admitting: *Deleted

## 2018-02-20 NOTE — Telephone Encounter (Signed)
CALLED PATIENT TO REMIND OF LABS FOR 02-21-18- ARRIVAL TIME- 9:45 AM @ [facility] ADMITTING, SPOKE WITH PATIENT AND HE IS AWARE OF THIS APPT.

## 2018-02-21 ENCOUNTER — Encounter (HOSPITAL_COMMUNITY)
Admission: RE | Admit: 2018-02-21 | Discharge: 2018-02-21 | Disposition: A | Discharge status: Home / Self Care | Payer: No Typology Code available for payment source | Source: Ambulatory Visit | Attending: Urology | Admitting: Urology

## 2018-02-21 DIAGNOSIS — C61 Malignant neoplasm of prostate: Secondary | ICD-10-CM | POA: Insufficient documentation

## 2018-02-21 DIAGNOSIS — Z01818 Encounter for other preprocedural examination: Secondary | ICD-10-CM | POA: Diagnosis present

## 2018-02-21 LAB — PROTIME-INR
INR: 1.03
PROTHROMBIN TIME: 13.4 s (ref 11.4–15.2)

## 2018-02-21 LAB — CBC
HEMATOCRIT: 43.2 % (ref 39.0–52.0)
HEMOGLOBIN: 14 g/dL (ref 13.0–17.0)
MCH: 29.7 pg (ref 26.0–34.0)
MCHC: 32.4 g/dL (ref 30.0–36.0)
MCV: 91.7 fL (ref 80.0–100.0)
Platelets: 200 10*3/uL (ref 150–400)
RBC: 4.71 MIL/uL (ref 4.22–5.81)
RDW: 13 % (ref 11.5–15.5)
WBC: 8.1 10*3/uL (ref 4.0–10.5)
nRBC: 0 % (ref 0.0–0.2)

## 2018-02-21 LAB — COMPREHENSIVE METABOLIC PANEL
ALBUMIN: 4.2 g/dL (ref 3.5–5.0)
ALT: 24 U/L (ref 0–44)
AST: 21 U/L (ref 15–41)
Alkaline Phosphatase: 73 U/L (ref 38–126)
Anion gap: 9 (ref 5–15)
BILIRUBIN TOTAL: 0.8 mg/dL (ref 0.3–1.2)
BUN: 19 mg/dL (ref 6–20)
CO2: 27 mmol/L (ref 22–32)
CREATININE: 1.11 mg/dL (ref 0.61–1.24)
Calcium: 9.3 mg/dL (ref 8.9–10.3)
Chloride: 105 mmol/L (ref 98–111)
GFR calc Af Amer: >60 mL/min (ref >60)
GLUCOSE: 89 mg/dL (ref 70–99)
POTASSIUM: 4.4 mmol/L (ref 3.5–5.1)
Sodium: 141 mmol/L (ref 135–145)
TOTAL PROTEIN: 7.3 g/dL (ref 6.5–8.1)

## 2018-02-21 LAB — APTT: aPTT: 29 seconds (ref 24–36)

## 2018-02-25 ENCOUNTER — Encounter (HOSPITAL_BASED_OUTPATIENT_CLINIC_OR_DEPARTMENT_OTHER): Payer: Self-pay | Admitting: *Deleted

## 2018-02-26 ENCOUNTER — Encounter (HOSPITAL_BASED_OUTPATIENT_CLINIC_OR_DEPARTMENT_OTHER): Payer: Self-pay | Admitting: *Deleted

## 2018-02-26 ENCOUNTER — Other Ambulatory Visit: Payer: Self-pay

## 2018-02-26 NOTE — Progress Notes (Addendum)
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

## 2018-02-27 ENCOUNTER — Telehealth: Payer: Self-pay | Admitting: *Deleted

## 2018-02-27 NOTE — Telephone Encounter (Signed)
CALLED PATIENT TO REMIND OF PROCEDURE FOR 02-28-18, SPOKE WITH PATIENT AND HE IS AWARE OF THIS PROCEDURE

## 2018-02-27 NOTE — H&P (Signed)
CC/HPI: [name] presents today for pre-operative history and physical exam in anticipation of radioactive seed implant with space oar on 02/28/18 by Dr. Jeffie Pollock. He is doing well and without complaint.   [name] denies F/C, HA, CP, SOB, N/V, diarrhea/constipation, back pain, flank pain, hematuria, and dysuria.   HX:     CC/HPI: I have prostate cancer.     Garan returns today in f/u to discuss his recent prostate biopsy that demonstrated a 53ml prostate with Gleason 6(3+3) in 6/12 cores with volumes from 40-80%. The right apical biopsies along with the left apical and left mid biopsies are involved. His PSA is 4.85. His SHIM is 25 and his IPSS is 6. He has T1c N0 M0 prostate cancer that is low risk but bulky.    ALLERGIES: None   MEDICATIONS: Metoprolol Tartrate 50 mg tablet  Atorvastatin Calcium 20 mg tablet  Celexa 40 mg tablet  Prilosec Otc 20 mg tablet, delayed release     GU PSH: Prostate Needle Biopsy - 11/12/2017      PSH Notes: heart cath approx 2010 that was clear   NON-GU PSH: Cholecystectomy (laparoscopic) Surgical Pathology, Gross And Microscopic Examination For Prostate Needle - 11/12/2017    GU PMH: Prostate Cancer, T1c N0 M0 Gleason 6 low risk prostate cancer but with the bulk of the disease I would consider it more consistent with intermediate risks. He has minimal voiding complaints and excellent erectile function. I discussed the options with him. I recommended against AS based on his age, the rising PSA and bulk of the tumor. I briefly discussed HIFU and Cryo and don't feel he is a good candidate for those. I discussed RALP, EXRT and Brachytherapy as mentioned below and at this time he is interested in brachytherapy. I will get him set up to see Dr. Tammi Klippel. I don't think he needs ADT. - 12/06/2017 Elevated PSA - 11/12/2017, He has an elevated PSA with a benign prostate on exam. I am going to get him set up for prostate [place] and biopsy and reviewed the risks of bleeding, infection and  voiding difficulty. , - 09/19/2017 BPH w/o LUTS - 09/19/2017    NON-GU PMH: Anxiety GERD Hypercholesterolemia Hypertension    FAMILY HISTORY: Aortic rupture - Mother heart failure - Father   SOCIAL HISTORY: Marital Status: Married Preferred Language: English; Race: White Current Smoking Status: Patient does not smoke anymore.   Tobacco Use Assessment Completed: Used Tobacco in last 30 days? Does not use smokeless tobacco. Does not drink anymore.  Does not use drugs. Drinks 2 caffeinated drinks per day. Has not had a blood transfusion. Patient's occupation Glass blower/designer.     Notes: 1 daughter    REVIEW OF SYSTEMS:    GU Review Male:   Patient denies frequent urination, hard to postpone urination, burning/ pain with urination, get up at night to urinate, leakage of urine, stream starts and stops, trouble starting your stream, have to strain to urinate , erection problems, and penile pain.  Gastrointestinal (Upper):   Patient reports indigestion/ heartburn. Patient denies nausea and vomiting.  Gastrointestinal (Lower):   Patient denies diarrhea and constipation.  Constitutional:   Patient denies fever, night sweats, weight loss, and fatigue.  Skin:   Patient denies skin rash/ lesion and itching.  Eyes:   Patient denies blurred vision and double vision.  Ears/ Nose/ Throat:   Patient denies sore throat and sinus problems.  Hematologic/Lymphatic:   Patient denies easy bruising and swollen glands.  Cardiovascular:  Patient denies leg swelling and chest pains.  Respiratory:   Patient denies cough and shortness of breath.  Endocrine:   Patient denies excessive thirst.  Musculoskeletal:   Patient denies back pain and joint pain.  Neurological:   Patient denies headaches and dizziness.  Psychologic:   Patient reports anxiety. Patient denies depression.   VITAL SIGNS:      02/26/2018 01:33 PM  Weight 245 lb / 111.13 kg  Height 73 in / 185.42 cm  BP 160/96 mmHg  Heart Rate  91 /min  Temperature 98.6 F / 37 C  BMI 32.3 kg/m   MULTI-SYSTEM PHYSICAL EXAMINATION:    Constitutional: Well-nourished. No physical deformities. Normally developed. Good grooming.  Neck: Neck symmetrical, not swollen. Normal tracheal position.  Respiratory: Normal breath sounds. No labored breathing, no use of accessory muscles.   Cardiovascular: Regular rate and rhythm. No murmur, no gallop. Normal temperature, normal extremity pulses, no swelling, no varicosities.   Lymphatic: No enlargement of neck, axillae, groin.  Skin: No paleness, no jaundice, no cyanosis. No lesion, no ulcer, no rash.  Neurologic / Psychiatric: Oriented to time, oriented to place, oriented to person. No depression, no anxiety, no agitation.  Gastrointestinal: No mass, no tenderness, no rigidity, non obese abdomen.  Eyes: Normal conjunctivae. Normal eyelids.  Ears, Nose, Mouth, and Throat: Left ear no scars, no lesions, no masses. Right ear no scars, no lesions, no masses. Nose no scars, no lesions, no masses. Normal hearing. Normal lips.  Musculoskeletal: Normal gait and station of head and neck.     PAST DATA REVIEWED:  Source Of History:  Patient  Records Review:   Previous Patient Records  Urine Test Review:   Urinalysis   11/05/17  PSA  Total PSA 4.85 ng/mL  Free PSA 0.61 ng/mL  % Free PSA 13 % PSA    02/26/18  Urinalysis  Urine Appearance Clear   Urine Color Yellow   Urine Glucose Neg mg/dL  Urine Bilirubin Neg mg/dL  Urine Ketones Trace mg/dL  Urine Specific Gravity 1.025   Urine Blood Neg ery/uL  Urine pH <=5.0   Urine Protein Neg mg/dL  Urine Urobilinogen 1.0 mg/dL  Urine Nitrites Neg   Urine Leukocyte Esterase Neg leu/uL   PROCEDURES:          Urinalysis - 81003 Dipstick Dipstick Cont'd  Color: Yellow Bilirubin: Neg mg/dL  Appearance: Clear Ketones: Trace mg/dL  Specific Gravity: 1.025 Blood: Neg ery/uL  pH: <=5.0 Protein: Neg mg/dL  Glucose: Neg mg/dL Urobilinogen: 1.0 mg/dL     Nitrites: Neg    Leukocyte Esterase: Neg leu/uL    ASSESSMENT:      ICD-10 Details  1 GU:   Prostate Cancer - C61    PLAN:           Schedule Return Visit/Planned Activity: Keep Scheduled Appointment - Schedule Surgery          Document Letter(s):  Created for Patient: Clinical Summary         Notes:   There are no changes in the patients history or physical exam since last evaluation by Dr. Jeffie Pollock. [name] is scheduled to undergo seed implant and space oar on 02/28/18.    All [name]'s questions were answered to the best of my ability.          Next Appointment:      Next Appointment: 02/28/2018 09:30 AM    Appointment Type: Surgery     Location: [facility] [phone]  Provider: Irine Seal, M.D.    Reason for Visit: [place]

## 2018-02-28 ENCOUNTER — Ambulatory Visit (HOSPITAL_BASED_OUTPATIENT_CLINIC_OR_DEPARTMENT_OTHER)
Admission: RE | Admit: 2018-02-28 | Discharge: 2018-02-28 | Disposition: A | Discharge status: Home / Self Care | Payer: No Typology Code available for payment source | Source: Ambulatory Visit | Attending: Urology | Admitting: Urology

## 2018-02-28 ENCOUNTER — Other Ambulatory Visit: Payer: Self-pay

## 2018-02-28 ENCOUNTER — Ambulatory Visit (HOSPITAL_COMMUNITY): Payer: No Typology Code available for payment source

## 2018-02-28 ENCOUNTER — Ambulatory Visit (HOSPITAL_BASED_OUTPATIENT_CLINIC_OR_DEPARTMENT_OTHER): Payer: No Typology Code available for payment source | Admitting: Certified Registered"

## 2018-02-28 ENCOUNTER — Encounter (HOSPITAL_BASED_OUTPATIENT_CLINIC_OR_DEPARTMENT_OTHER): Payer: Self-pay | Admitting: *Deleted

## 2018-02-28 ENCOUNTER — Encounter (HOSPITAL_BASED_OUTPATIENT_CLINIC_OR_DEPARTMENT_OTHER)
Admission: RE | Disposition: A | Discharge status: Home / Self Care | Payer: Self-pay | Source: Ambulatory Visit | Attending: Urology

## 2018-02-28 DIAGNOSIS — I10 Essential (primary) hypertension: Secondary | ICD-10-CM | POA: Diagnosis not present

## 2018-02-28 DIAGNOSIS — Z87891 Personal history of nicotine dependence: Secondary | ICD-10-CM | POA: Diagnosis not present

## 2018-02-28 DIAGNOSIS — K219 Gastro-esophageal reflux disease without esophagitis: Secondary | ICD-10-CM | POA: Diagnosis not present

## 2018-02-28 DIAGNOSIS — Z79899 Other long term (current) drug therapy: Secondary | ICD-10-CM | POA: Diagnosis not present

## 2018-02-28 DIAGNOSIS — C61 Malignant neoplasm of prostate: Secondary | ICD-10-CM | POA: Diagnosis present

## 2018-02-28 DIAGNOSIS — E7800 Pure hypercholesterolemia, unspecified: Secondary | ICD-10-CM | POA: Diagnosis not present

## 2018-02-28 HISTORY — DX: Atherosclerotic heart disease of native coronary artery without angina pectoris: I25.10

## 2018-02-28 HISTORY — DX: Essential (primary) hypertension: I10

## 2018-02-28 HISTORY — PX: SPACE OAR INSTILLATION: SHX6769

## 2018-02-28 HISTORY — DX: Hyperlipidemia, unspecified: E78.5

## 2018-02-28 HISTORY — PX: RADIOACTIVE SEED IMPLANT: SHX5150

## 2018-02-28 HISTORY — DX: Gastro-esophageal reflux disease without esophagitis: K21.9

## 2018-02-28 HISTORY — PX: CYSTOSCOPY: SHX5120

## 2018-02-28 SURGERY — INSERTION, RADIATION SOURCE, PROSTATE
Anesthesia: General | Site: Rectum

## 2018-02-28 MED ORDER — CIPROFLOXACIN IN D5W 400 MG/200ML IV SOLN
INTRAVENOUS | Status: AC
  Filled 2018-02-28: qty 200

## 2018-02-28 MED ORDER — DEXAMETHASONE SODIUM PHOSPHATE 10 MG/ML IJ SOLN
INTRAMUSCULAR | Status: DC | PRN
  Administered 2018-02-28: 10 mg via INTRAVENOUS

## 2018-02-28 MED ORDER — ONDANSETRON HCL 4 MG/2ML IJ SOLN
INTRAMUSCULAR | Status: DC | PRN
  Administered 2018-02-28: 4 mg via INTRAVENOUS

## 2018-02-28 MED ORDER — EPHEDRINE 5 MG/ML INJ
INTRAVENOUS | Status: AC
  Filled 2018-02-28: qty 10

## 2018-02-28 MED ORDER — LIDOCAINE 2% (20 MG/ML) 5 ML SYRINGE
INTRAMUSCULAR | Status: AC
  Filled 2018-02-28: qty 5

## 2018-02-28 MED ORDER — SODIUM CHLORIDE 0.9 % IJ SOLN
INTRAMUSCULAR | Status: DC | PRN
  Administered 2018-02-28: 10 mL

## 2018-02-28 MED ORDER — LACTATED RINGERS IV SOLN
INTRAVENOUS | Status: DC
  Administered 2018-02-28: 08:00:00 via INTRAVENOUS
  Filled 2018-02-28: qty 1000

## 2018-02-28 MED ORDER — HYDROCODONE-ACETAMINOPHEN 5-325 MG PO TABS: 1.0000 | ORAL_TABLET | Freq: Four times a day (QID) | ORAL | 0 refills | Status: AC | PRN

## 2018-02-28 MED ORDER — ONDANSETRON HCL 4 MG/2ML IJ SOLN
INTRAMUSCULAR | Status: AC
  Filled 2018-02-28: qty 2

## 2018-02-28 MED ORDER — SCOPOLAMINE 1 MG/3DAYS TD PT72
MEDICATED_PATCH | TRANSDERMAL | Status: AC
  Filled 2018-02-28: qty 1

## 2018-02-28 MED ORDER — MORPHINE SULFATE (PF) 2 MG/ML IV SOLN
2.0000 mg | INTRAVENOUS | Status: DC | PRN
  Filled 2018-02-28: qty 1

## 2018-02-28 MED ORDER — CIPROFLOXACIN IN D5W 400 MG/200ML IV SOLN
400.0000 mg | INTRAVENOUS | Status: AC
  Administered 2018-02-28: 400 mg via INTRAVENOUS
  Filled 2018-02-28: qty 200

## 2018-02-28 MED ORDER — SODIUM CHLORIDE 0.9% FLUSH
3.0000 mL | Freq: Two times a day (BID) | INTRAVENOUS | Status: DC
  Filled 2018-02-28: qty 3

## 2018-02-28 MED ORDER — FENTANYL CITRATE (PF) 100 MCG/2ML IJ SOLN
INTRAMUSCULAR | Status: AC
  Filled 2018-02-28: qty 2

## 2018-02-28 MED ORDER — PROPOFOL 10 MG/ML IV BOLUS
INTRAVENOUS | Status: AC
  Filled 2018-02-28: qty 20

## 2018-02-28 MED ORDER — FENTANYL CITRATE (PF) 100 MCG/2ML IJ SOLN
INTRAMUSCULAR | Status: DC | PRN
  Administered 2018-02-28: 50 ug via INTRAVENOUS
  Administered 2018-02-28 (×2): 25 ug via INTRAVENOUS

## 2018-02-28 MED ORDER — EPHEDRINE SULFATE-NACL 50-0.9 MG/10ML-% IV SOSY
PREFILLED_SYRINGE | INTRAVENOUS | Status: DC | PRN
  Administered 2018-02-28 (×2): 5 mg via INTRAVENOUS

## 2018-02-28 MED ORDER — PROPOFOL 10 MG/ML IV BOLUS
INTRAVENOUS | Status: DC | PRN
  Administered 2018-02-28: 200 mg via INTRAVENOUS

## 2018-02-28 MED ORDER — DEXAMETHASONE SODIUM PHOSPHATE 10 MG/ML IJ SOLN
INTRAMUSCULAR | Status: AC
  Filled 2018-02-28: qty 1

## 2018-02-28 MED ORDER — SODIUM CHLORIDE 0.9 % IV SOLN
250.0000 mL | INTRAVENOUS | Status: DC | PRN
  Filled 2018-02-28: qty 250

## 2018-02-28 MED ORDER — SODIUM CHLORIDE 0.9% FLUSH
3.0000 mL | INTRAVENOUS | Status: DC | PRN
  Filled 2018-02-28: qty 3

## 2018-02-28 MED ORDER — IOHEXOL 300 MG/ML  SOLN
INTRAMUSCULAR | Status: DC | PRN
  Administered 2018-02-28: 7 mL

## 2018-02-28 MED ORDER — SCOPOLAMINE 1 MG/3DAYS TD PT72
1.0000 | MEDICATED_PATCH | TRANSDERMAL | Status: DC
  Administered 2018-02-28: 1.5 mg via TRANSDERMAL
  Filled 2018-02-28: qty 1

## 2018-02-28 MED ORDER — MIDAZOLAM HCL 2 MG/2ML IJ SOLN
INTRAMUSCULAR | Status: AC
  Filled 2018-02-28: qty 2

## 2018-02-28 MED ORDER — ACETAMINOPHEN 650 MG RE SUPP
650.0000 mg | RECTAL | Status: DC | PRN
  Filled 2018-02-28: qty 1

## 2018-02-28 MED ORDER — MIDAZOLAM HCL 2 MG/2ML IJ SOLN
INTRAMUSCULAR | Status: DC | PRN
  Administered 2018-02-28: 2 mg via INTRAVENOUS

## 2018-02-28 MED ORDER — PROMETHAZINE HCL 25 MG/ML IJ SOLN
6.2500 mg | INTRAMUSCULAR | Status: DC | PRN
  Filled 2018-02-28: qty 1

## 2018-02-28 MED ORDER — FLEET ENEMA 7-19 GM/118ML RE ENEM
1.0000 | ENEMA | Freq: Once | RECTAL | Status: AC
  Administered 2018-02-28: 1 via RECTAL
  Filled 2018-02-28: qty 1

## 2018-02-28 MED ORDER — ACETAMINOPHEN 325 MG PO TABS
650.0000 mg | ORAL_TABLET | ORAL | Status: DC | PRN
  Filled 2018-02-28: qty 2

## 2018-02-28 MED ORDER — LIDOCAINE 2% (20 MG/ML) 5 ML SYRINGE
INTRAMUSCULAR | Status: DC | PRN
  Administered 2018-02-28: 80 mg via INTRAVENOUS

## 2018-02-28 MED ORDER — HYDROMORPHONE HCL 1 MG/ML IJ SOLN
0.2500 mg | INTRAMUSCULAR | Status: DC | PRN
  Administered 2018-02-28 (×2): 0.5 mg via INTRAVENOUS
  Filled 2018-02-28: qty 0.5

## 2018-02-28 MED ORDER — HYDROMORPHONE HCL 1 MG/ML IJ SOLN
INTRAMUSCULAR | Status: AC
  Filled 2018-02-28: qty 1

## 2018-02-28 MED ORDER — SODIUM CHLORIDE 0.9 % IR SOLN
Status: DC | PRN
  Administered 2018-02-28: 1000 mL via INTRAVESICAL

## 2018-02-28 MED ORDER — OXYCODONE HCL 5 MG PO TABS
5.0000 mg | ORAL_TABLET | ORAL | Status: DC | PRN
  Filled 2018-02-28: qty 2

## 2018-02-28 SURGICAL SUPPLY — 44 items
BAG URINE DRAINAGE (UROLOGICAL SUPPLIES) ×5 IMPLANT
BLADE CLIPPER SURG (BLADE) ×5 IMPLANT
CATH FOLEY 2WAY SLVR  5CC 16FR (CATHETERS) ×2
CATH FOLEY 2WAY SLVR 5CC 16FR (CATHETERS) ×3 IMPLANT
CATH ROBINSON RED A/P 16FR (CATHETERS) IMPLANT
CATH ROBINSON RED A/P 20FR (CATHETERS) ×5 IMPLANT
CLOTH BEACON ORANGE TIMEOUT ST (SAFETY) ×5 IMPLANT
CONT SPECI 4OZ STER CLIK (MISCELLANEOUS) ×10 IMPLANT
COVER BACK TABLE 60X90IN (DRAPES) ×5 IMPLANT
COVER MAYO STAND STRL (DRAPES) ×5 IMPLANT
DRSG TEGADERM 4X4.75 (GAUZE/BANDAGES/DRESSINGS) ×5 IMPLANT
DRSG TEGADERM 8X12 (GAUZE/BANDAGES/DRESSINGS) ×10 IMPLANT
GAUZE SPONGE 4X4 12PLY STRL (GAUZE/BANDAGES/DRESSINGS) ×5 IMPLANT
GLOVE BIO SURGEON STRL SZ 6 (GLOVE) IMPLANT
GLOVE BIO SURGEON STRL SZ 6.5 (GLOVE) ×4 IMPLANT
GLOVE BIO SURGEON STRL SZ7 (GLOVE) IMPLANT
GLOVE BIO SURGEON STRL SZ8 (GLOVE) IMPLANT
GLOVE BIO SURGEONS STRL SZ 6.5 (GLOVE) ×1
GLOVE BIOGEL PI IND STRL 6 (GLOVE) IMPLANT
GLOVE BIOGEL PI IND STRL 6.5 (GLOVE) ×3 IMPLANT
GLOVE BIOGEL PI IND STRL 8 (GLOVE) IMPLANT
GLOVE BIOGEL PI INDICATOR 6 (GLOVE)
GLOVE BIOGEL PI INDICATOR 6.5 (GLOVE) ×2
GLOVE BIOGEL PI INDICATOR 8 (GLOVE)
GLOVE ECLIPSE 8.0 STRL XLNG CF (GLOVE) ×5 IMPLANT
GLOVE INDICATOR 7.0 STRL GRN (GLOVE) IMPLANT
GLOVE SURG SS PI 8.0 STRL IVOR (GLOVE) ×10 IMPLANT
GOWN STRL REUS W/ TWL XL LVL3 (GOWN DISPOSABLE) ×3 IMPLANT
GOWN STRL REUS W/TWL XL LVL3 (GOWN DISPOSABLE) ×7 IMPLANT
HOLDER FOLEY CATH W/STRAP (MISCELLANEOUS) ×5 IMPLANT
I-SEED AGX 100 ×395 IMPLANT
IMPL SPACEOAR SYSTEM 10ML (Spacer) ×3 IMPLANT
IMPLANT SPACEOAR SYSTEM 10ML (Spacer) ×5 IMPLANT
IV NS 1000ML (IV SOLUTION) ×2
IV NS 1000ML BAXH (IV SOLUTION) ×3 IMPLANT
KIT TURNOVER CYSTO (KITS) ×5 IMPLANT
MARKER SKIN DUAL TIP RULER LAB (MISCELLANEOUS) ×5 IMPLANT
PACK CYSTO (CUSTOM PROCEDURE TRAY) ×5 IMPLANT
SURGILUBE 2OZ TUBE FLIPTOP (MISCELLANEOUS) IMPLANT
SUT BONE WAX W31G (SUTURE) IMPLANT
SYR 10ML LL (SYRINGE) ×5 IMPLANT
UNDERPAD 30X30 (UNDERPADS AND DIAPERS) ×10 IMPLANT
WATER STERILE IRR 3000ML UROMA (IV SOLUTION) ×5 IMPLANT
WATER STERILE IRR 500ML POUR (IV SOLUTION) ×5 IMPLANT

## 2018-02-28 NOTE — Transfer of Care (Signed)
Immediate Anesthesia Transfer of Care Note  Patient: [name]  Procedure(s) Performed: Procedure(s) (LRB): RADIOACTIVE SEED IMPLANT/BRACHYTHERAPY IMPLANT (N/A) SPACE OAR INSTILLATION (N/A) CYSTOSCOPY (N/A)  Patient Location: PACU  Anesthesia Type: General  Level of Consciousness: awake, oriented, sedated and patient cooperative  Airway & Oxygen Therapy: Patient Spontanous Breathing and Patient connected to face mask oxygen  Post-op Assessment: Report given to PACU RN and Post -op Vital signs reviewed and stable  Post vital signs: Reviewed and stable  Complications: No apparent anesthesia complications  Last Vitals:  Vitals Value Taken Time  BP 157/98 02/28/2018 11:11 AM  Temp 37 C 02/28/2018 11:11 AM  Pulse 92 02/28/2018 11:11 AM  Resp 5 02/28/2018 11:11 AM  SpO2 99 % 02/28/2018 11:11 AM  Vitals shown include unvalidated device data.  Last Pain:  Vitals:   02/28/18 0746  TempSrc:   PainSc: 0-No pain      Patients Stated Pain Goal: 8 (02/28/18 0746)

## 2018-02-28 NOTE — Anesthesia Procedure Notes (Signed)
Procedure Name: LMA Insertion Date/Time: 02/28/2018 9:41 AM Performed by: Suan Halter, CRNA Pre-anesthesia Checklist: Patient identified, Emergency Drugs available, Suction available and Patient being monitored Patient Re-evaluated:Patient Re-evaluated prior to induction Oxygen Delivery Method: Circle system utilized Preoxygenation: Pre-oxygenation with 100% oxygen Induction Type: IV induction Ventilation: Mask ventilation without difficulty LMA: LMA inserted LMA Size: 4.0 Number of attempts: 1 Airway Equipment and Method: Bite block Placement Confirmation: positive ETCO2 Tube secured with: Tape Dental Injury: Teeth and Oropharynx as per pre-operative assessment

## 2018-02-28 NOTE — Interval H&P Note (Signed)
History and Physical Interval Note:  02/28/2018 8:47 AM  [name]  has presented today for surgery, with the diagnosis of prostate cancer  The various methods of treatment have been discussed with the patient and family. After consideration of risks, benefits and other options for treatment, the patient has consented to  Procedure(s): RADIOACTIVE SEED IMPLANT/BRACHYTHERAPY IMPLANT (N/A) SPACE OAR INSTILLATION (N/A) as a surgical intervention .  The patient's history has been reviewed, patient examined, no change in status, stable for surgery.  I have reviewed the patient's chart and labs.  Questions were answered to the patient's satisfaction.     Irine Seal

## 2018-02-28 NOTE — Op Note (Signed)
PATIENT:  [name]  PRE-OPERATIVE DIAGNOSIS:  Adenocarcinoma of the prostate  POST-OPERATIVE DIAGNOSIS:  Same  PROCEDURE:  Procedure(s): 1. I-125 radioactive seed implantation 2. Cystoscopy  SURGEON:  Surgeon(s): Irine Seal MD  Radiation oncologist: Dr. Tyler Pita  ANESTHESIA:  General  EBL:  Minimal  DRAINS: 70 French Foley catheter  INDICATION: [name] is a 57 y.o. with Stage T1c, Gleason 6 prostate cancer who has elected brachytherapy for treatment.  Description of procedure: After informed consent the patient was brought to the major OR, placed on the table and administered general anesthesia. He was then moved to the modified lithotomy position with his perineum perpendicular to the floor. His perineum and genitalia were then sterilely prepped. An official timeout was then performed. A 16 French Foley catheter was then placed in the bladder and filled with dilute contrast, a rectal tube was placed in the rectum and the transrectal ultrasound probe was placed in the rectum and affixed to the stand. He was then sterilely draped.  The sterile grid was installed.   Anchor needles were then placed.   Real time ultrasonography was used along with the seed planning software spot-pro version 3.1-00. This was used to develop the seed plan including the number of needles as well as number of seeds required for complete and adequate coverage. Real-time ultrasonography was then used along with the previously developed plan to implant a total of 79 seeds in strands using 27 needles for a target dose of 145 Gy. This proceeded without difficulty or complication.  After completion of the seed implant, the stabilization needles and grid were removed and the SpaceOAR needle was placed under US guidance into the fat stripe posterior to the prostate at the mid to proximal gland.  Saline was injected to confirm good midline placement and then aspiration demonstrated no blood.  The  SpaceOAR polymer was injected over 10 seconds with excellent distribution.   A Foley catheter was then removed as well as the transrectal ultrasound probe and rectal probe. Flexible cystoscopy was then performed using the 17 French flexible scope which revealed a normal urethra throughout its length down to the sphincter which appeared intact. The prostatic urethra was 3cm with bilobar hyperplasia. The bladder was then entered and fully and systematically inspected.  The ureteral orifices were noted to be of normal configuration and position. The mucosa revealed no evidence of tumors. There was mild trabeculation. There were also no stones identified within the bladder.  No seeds or spacers were seen and/or removed from the bladder.  The cystoscope was then removed.  The drapes were removed.  The perineum was cleaned and dressed.  He was taken out of the lithotomy position and was awakened and taken to recovery room in stable and satisfactory condition. He tolerated procedure well and there were no intraoperative complications.

## 2018-02-28 NOTE — Anesthesia Preprocedure Evaluation (Addendum)
Anesthesia Evaluation  Patient identified by MRN, date of birth, ID band Patient awake    Reviewed: Allergy & Precautions, NPO status , Patient's Chart, lab work & pertinent test results, reviewed documented beta blocker date and time   Airway Mallampati: III  TM Distance: >3 FB Neck ROM: Full    Dental no notable dental hx. (+) Dental Advisory Given   Pulmonary former smoker,    Pulmonary exam normal        Cardiovascular hypertension, Pt. on medications and Pt. on home beta blockers negative cardio ROS Normal cardiovascular exam     Neuro/Psych negative neurological ROS  negative psych ROS   GI/Hepatic negative GI ROS, Neg liver ROS, GERD  ,  Endo/Other  negative endocrine ROS  Renal/GU negative Renal ROS     Musculoskeletal negative musculoskeletal ROS (+)   Abdominal   Peds  Hematology negative hematology ROS (+)   Anesthesia Other Findings Day of surgery medications reviewed with the patient.  Reproductive/Obstetrics                            Anesthesia Physical Anesthesia Plan  ASA: II  Anesthesia Plan: General   Post-op Pain Management:    Induction: Intravenous  PONV Risk Score and Plan: 3 and Ondansetron, Dexamethasone and Diphenhydramine  Airway Management Planned: LMA and Oral ETT  Additional Equipment:   Intra-op Plan:   Post-operative Plan: Extubation in OR  Informed Consent: I have reviewed the patients History and Physical, chart, labs and discussed the procedure including the risks, benefits and alternatives for the proposed anesthesia with the patient or authorized representative who has indicated his/her understanding and acceptance.   Dental advisory given  Plan Discussed with: Anesthesiologist and CRNA  Anesthesia Plan Comments:         Anesthesia Quick Evaluation

## 2018-02-28 NOTE — Anesthesia Postprocedure Evaluation (Signed)
Anesthesia Post Note  Patient: [name]  Procedure(s) Performed: RADIOACTIVE SEED IMPLANT/BRACHYTHERAPY IMPLANT (N/A Prostate) SPACE OAR INSTILLATION (N/A Rectum) CYSTOSCOPY (N/A Bladder)     Patient location during evaluation: PACU Anesthesia Type: General Level of consciousness: sedated Pain management: pain level controlled Vital Signs Assessment: post-procedure vital signs reviewed and stable Respiratory status: spontaneous breathing and respiratory function stable Cardiovascular status: stable Postop Assessment: no apparent nausea or vomiting Anesthetic complications: no    Last Vitals:  Vitals:   02/28/18 1145 02/28/18 1234  BP: (!) 131/95 131/84  Pulse: 81 81  Resp: 11 16  Temp:  36.8 C  SpO2: 95% 100%    Last Pain:  Vitals:   02/28/18 1234  TempSrc: Oral  PainSc:                  Darlette Dubow DANIEL

## 2018-02-28 NOTE — Discharge Instructions (Signed)
Brachytherapy for Prostate Cancer, Care After Refer to this sheet in the next few weeks. These instructions provide you with information on caring for yourself after your procedure. Your health care provider may also give you more specific instructions. Your treatment has been planned according to current medical practices, but problems sometimes occur. Call your health care provider if you have any problems or questions after your procedure. What can I expect after the procedure? The area behind the scrotum will probably be tender and bruised. For a short period of time you may have:  Difficulty passing urine. You may need a catheter for a few days to a month.  Blood in the urine or semen.  A feeling of constipation because of prostate swelling.  Frequent feeling of an urgent need to urinate.  For a long period of time you may have:  Inflammation of the rectum. This happens in about 2% of people who have the procedure.  Erection problems. These vary with age and occur in about 15-40% of men.  Difficulty urinating. This is caused by scarring in the urethra.  Diarrhea.  Follow these instructions at home:  Take medicines only as directed by your health care provider.  You will probably have a catheter in your bladder for several days. You will have blood in the urine bag and should drink a lot of fluids to keep it a light red color.  Keep all follow-up visits as directed by your health care provider. If you have a catheter, it will be removed during one of these visits.  Try not to sit directly on the area behind the scrotum. A soft cushion can decrease the discomfort. Ice packs may also be helpful for the discomfort. Do not put ice directly on the skin.  Shower and wash the area behind the scrotum gently. Do not sit in a tub.  If you have had the brachytherapy that uses the seeds, limit your close contact with children and pregnant women for 2 months because of the radiation still  in the prostate. After that period of time, the levels drop off quickly. Get help right away if:  You have a fever.  You have chills.  You have shortness of breath.  You have chest pain.  You have thick blood, like tomato juice, in the urine bag.  Your catheter is blocked so urine cannot get into the bag. Your bladder area or lower abdomen may be swollen.  There is excessive bleeding from your rectum. It is normal to have a little blood mixed with your stool.  There is severe discomfort in the treated area that does not go away with pain medicine.  You have abdominal discomfort.  You have severe nausea or vomiting.  You develop any new or unusual symptoms. This information is not intended to replace advice given to you by your health care provider. Make sure you discuss any questions you have with your health care provider. Document Released: 06/03/2010 Document Revised: 10/13/2015 Document Reviewed: 10/22/2012 Elsevier Interactive Patient Education  2017 [facility] [place] [facility] Home Care Instructions  Activity: Get plenty of rest for the remainder of the day. A responsible individual must stay with you for 24 hours following the procedure.  For the next 24 hours, DO NOT: -Drive a car -Paediatric nurse -Drink alcoholic beverages -Take any medication unless instructed by your physician -Make any legal decisions or sign important papers.  Meals: Start with liquid foods such as gelatin or soup. Progress to regular  foods as tolerated. Avoid greasy, spicy, heavy foods. If nausea and/or vomiting occur, drink only clear liquids until the nausea and/or vomiting subsides. Call your physician if vomiting continues.  Special Instructions/Symptoms: Your throat may feel dry or sore from the anesthesia or the breathing tube placed in your throat during surgery. If this causes discomfort, gargle with warm salt water. The discomfort should disappear within 24 hours.

## 2018-03-01 ENCOUNTER — Encounter (HOSPITAL_BASED_OUTPATIENT_CLINIC_OR_DEPARTMENT_OTHER): Payer: Self-pay | Admitting: Urology

## 2018-03-01 NOTE — Progress Notes (Signed)
  Radiation Oncology         [phone] ________________________________  Name: [name] MRN: [id number]  Date: 03/01/2018  DOB: 07-30-1960       Prostate Seed Implant  WU:[id number], [place], MD  No ref. provider found  DIAGNOSIS: A 57 year old gentlemen with stage T1c adenocarcinoma of the prostate with Gleason Score of 3+3, and PSA of 4.85  PROCEDURE: Insertion of radioactive I-125 seeds into the prostate gland.  RADIATION DOSE: 145 Gy, definitive therapy.  TECHNIQUE: [name] was brought to the operating room with the urologist. He was placed in the dorsolithotomy position. He was catheterized and a rectal tube was inserted. The perineum was shaved, prepped and draped. The ultrasound probe was then introduced into the rectum to see the prostate gland.  TREATMENT DEVICE: A needle grid was attached to the ultrasound probe stand and anchor needles were placed.  3D PLANNING: The prostate was imaged in 3D using a sagittal sweep of the prostate probe. These images were transferred to the planning computer. There, the prostate, urethra and rectum were defined on each axial reconstructed image. Then, the software created an optimized 3D plan and a few seed positions were adjusted. The quality of the plan was reviewed using [facility] information for the target and the following two organs at risk:  Urethra and Rectum.  Then the accepted plan was printed and handed off to the radiation therapist.  Under my supervision, the custom loading of the seeds and spacers was carried out and loaded into sealed vicryl sleeves.  These pre-loaded needles were then placed into the needle holder.Marland Kitchen  PROSTATE VOLUME STUDY:  Using transrectal ultrasound the volume of the prostate was verified to be 43 cc.  SPECIAL TREATMENT PROCEDURE/SUPERVISION AND HANDLING: The pre-loaded needles were then delivered under sagittal guidance. A total of 27 needles were used to deposit 79 seeds in the prostate gland. The  individual seed activity was 0.451 mCi.  SpaceOAR:  Yes  COMPLEX SIMULATION: At the end of the procedure, an anterior radiograph of the pelvis was obtained to document seed positioning and count. Cystoscopy was performed to check the urethra and bladder.  MICRODOSIMETRY: At the end of the procedure, the patient was emitting 0.134 mR/hr at 1 meter. Accordingly, he was considered safe for hospital discharge.  PLAN: The patient will return to the radiation oncology clinic for post implant CT dosimetry in three weeks.   ________________________________  Sheral Apley Tammi Klippel, M.D.

## 2018-03-14 ENCOUNTER — Telehealth: Payer: Self-pay | Admitting: *Deleted

## 2018-03-14 NOTE — Telephone Encounter (Signed)
Called patient to remind of post seed appts. and MRI, spoke with patient and he is aware of these appts. 

## 2018-03-15 ENCOUNTER — Ambulatory Visit
Admission: RE | Admit: 2018-03-15 | Discharge: 2018-03-15 | Disposition: A | Discharge status: Home / Self Care | Payer: No Typology Code available for payment source | Source: Ambulatory Visit | Attending: Radiation Oncology | Admitting: Radiation Oncology

## 2018-03-15 ENCOUNTER — Encounter: Payer: Self-pay | Admitting: Radiation Oncology

## 2018-03-15 ENCOUNTER — Other Ambulatory Visit: Payer: Self-pay

## 2018-03-15 VITALS — BP 130/82 | HR 63 | Temp 98.0°F | Resp 20 | Ht 72.0 in | Wt 247.8 lb

## 2018-03-15 DIAGNOSIS — C61 Malignant neoplasm of prostate: Secondary | ICD-10-CM

## 2018-03-15 DIAGNOSIS — Z79899 Other long term (current) drug therapy: Secondary | ICD-10-CM | POA: Diagnosis not present

## 2018-03-15 DIAGNOSIS — Z923 Personal history of irradiation: Secondary | ICD-10-CM | POA: Insufficient documentation

## 2018-03-15 NOTE — Progress Notes (Signed)
Radiation Oncology         [phone] ________________________________  Name: [name] MRN: [id number]  Date: 03/15/2018  DOB: January 27, 1961  Post-Seed Follow-Up Visit Note  CC: Lujean Amel, MD  Irine Seal, MD  Diagnosis:   A 57 year old gentlemen with stage T1c adenocarcinoma of the prostate with Gleason Score of 3+3, and PSA of 4.85    ICD-10-CM   1. Malignant neoplasm of prostate ([place]) C61     Interval Since Last Radiation:  2 weeks 02/28/18: Insertion of radioactive I-125 seeds into the prostate gland; 145 Gy, definitive therapy.  Narrative:  The patient returns today for routine follow-up.  He is complaining of increased urinary frequency and urinary hesitation symptoms. He filled out a questionnaire regarding urinary function today providing and overall IPSS score of 11 characterizing his symptoms as mild-moderate with mild increased frequency, weakened force of stream and occasional feelings of incomplete emptying.  He specifically denies dysuria, gross hematuria, urgency, straining or incontinence.  In general, he feels that he empties his bladder well, more often than not, with voiding.  His pre-implant score was 4. He denies abdominal pain, nausea, vomiting, diarrhea or constipation.  He reports a healthy appetite and is maintaining his weight.  He has not noticed any significant impact on his energy level and has been able to remain active.  He has a scheduled follow-up visit with Jiles Crocker, NP at [facility] on March 21, 2018.  ALLERGIES:  has No Known Allergies.  Meds: Current Outpatient Medications  Medication Sig Dispense Refill  . atorvastatin (LIPITOR) 20 MG tablet Take 20 mg by mouth daily.    . citalopram (CELEXA) 40 MG tablet Take 40 mg by mouth daily.    . metoprolol tartrate (LOPRESSOR) 50 MG tablet Take 50 mg by mouth daily.     Marland Kitchen omeprazole (PRILOSEC) 20 MG capsule Take 20 mg by mouth daily.    Marland Kitchen HYDROcodone-acetaminophen (NORCO/VICODIN) 5-325  MG tablet Take 1 tablet by mouth every 6 (six) hours as needed for moderate pain. (Patient not taking: Reported on 03/15/2018) 8 tablet 0   No current facility-administered medications for this encounter.     Physical Findings: In general this is a well appearing Caucasian male in no acute distress.  He's alert and oriented x4 and appropriate throughout the examination. Cardiopulmonary assessment is negative for acute distress and he exhibits normal effort.   Lab Findings: Lab Results  Component Value Date   WBC 8.1 02/21/2018   HGB 14.0 02/21/2018   HCT 43.2 02/21/2018   MCV 91.7 02/21/2018   PLT 200 02/21/2018    Radiographic Findings:  Patient underwent CT imaging in our clinic for post implant dosimetry. The CT was reviewed by Dr. Tammi Klippel and appears to demonstrate an adequate distribution of radioactive seeds throughout the prostate gland. There are no seeds in or near the rectum.  He is scheduled for a prostate MRI on 03/16/2018 and those images will be fused with his CT images for further evaluation.  We suspect the final radiation plan and dosimetry will show appropriate coverage of the prostate gland.   Impression/Plan: A 57 year old gentlemen with stage T1c adenocarcinoma of the prostate with Gleason Score of 3+3, and PSA of 4.85. The patient is recovering from the effects of radiation. His urinary symptoms should gradually improve over the next 4-6 months. We talked about this today. He is encouraged by his improvement already and is otherwise pleased with his outcome. We also talked  about long-term follow-up for prostate cancer following seed implant. He understands that ongoing PSA determinations and digital rectal exams will help perform surveillance to rule out disease recurrence. He has a follow up appointment scheduled with Jiles Crocker, NP on 03/21/2018. He understands what to expect with his PSA measures. Patient was also educated today about some of the long-term effects from  radiation including a small risk for rectal bleeding and possibly erectile dysfunction. We talked about some of the general management approaches to these potential complications. However, I did encourage the patient to contact our office or return at any point if he has questions or concerns related to his previous radiation and prostate cancer.    Nicholos Johns, PA-C

## 2018-03-15 NOTE — Progress Notes (Signed)
Weight and vital stable. Denies pain. Post seed IPSS 11. Denies dysuria, hematuria, urinary leakage or incontinence. Scheduled for MRI tomorrow to confirm SpaceOar placement. Scheduled to follow up with urologist on November 7th. Denies any needs at this time.   BP 130/82 (BP Location: Right Arm, Patient Position: Sitting)   Pulse 63   Temp 98 F (36.7 C) (Oral)   Resp 20   Ht 6' (1.829 m)   Wt 247 lb 12.8 oz (112.4 kg)   SpO2 99%   BMI 33.61 kg/m  Wt Readings from Last 3 Encounters:  03/15/18 247 lb 12.8 oz (112.4 kg)  02/28/18 248 lb 4.8 oz (112.6 kg)  12/17/17 256 lb (116.1 kg)

## 2018-03-15 NOTE — Progress Notes (Signed)
  Radiation Oncology         [phone] ________________________________  Name: [name] MRN: [id number]  Date: 03/15/2018  DOB: 1960/09/02  COMPLEX SIMULATION NOTE  NARRATIVE:  The patient was brought to the [place] suite today following prostate seed implantation approximately one month ago.  Identity was confirmed.  All relevant records and images related to the planned course of therapy were reviewed.  Then, the patient was set-up supine.  CT images were obtained.  The CT images were loaded into the planning software.  Then the prostate and rectum were contoured.  Treatment planning then occurred.  The implanted iodine 125 seeds were identified by the physics staff for projection of radiation distribution  I have requested : 3D Simulation  I have requested a DVH of the following structures: Prostate and rectum.    ________________________________  Sheral Apley Tammi Klippel, M.D.  This document serves as a record of services personally performed by Tyler Pita, MD. It was created on his behalf by Wilburn Mylar, a trained medical scribe. The creation of this record is based on the scribe's personal observations and the provider's statements to them. This document has been checked and approved by the attending provider.

## 2018-03-16 ENCOUNTER — Ambulatory Visit (HOSPITAL_COMMUNITY)
Admission: RE | Admit: 2018-03-16 | Discharge: 2018-03-16 | Disposition: A | Discharge status: Home / Self Care | Payer: No Typology Code available for payment source | Source: Ambulatory Visit | Attending: Urology | Admitting: Urology

## 2018-03-16 DIAGNOSIS — C61 Malignant neoplasm of prostate: Secondary | ICD-10-CM

## 2018-04-23 ENCOUNTER — Encounter: Payer: Self-pay | Admitting: Radiation Oncology

## 2018-04-23 ENCOUNTER — Ambulatory Visit: Payer: No Typology Code available for payment source | Attending: Radiation Oncology | Admitting: Radiation Oncology

## 2018-04-23 DIAGNOSIS — Z510 Encounter for antineoplastic radiation therapy: Secondary | ICD-10-CM | POA: Insufficient documentation

## 2018-04-23 DIAGNOSIS — C61 Malignant neoplasm of prostate: Secondary | ICD-10-CM | POA: Diagnosis not present

## 2018-04-28 NOTE — Progress Notes (Signed)
  Radiation Oncology         [phone] ________________________________  Name: [name] MRN: [id number]  Date: 04/23/2018  DOB: 08-10-1960  3D Planning Note   Prostate Brachytherapy Post-Implant Dosimetry  Diagnosis: 57 y.o. gentleman with low risk, Stage T1c adenocarcinoma of the prostate with Gleason Score of 3+3, and PSA of 4.85  Narrative: On a previous date, [name] returned following prostate seed implantation for post implant planning. He underwent CT scan complex simulation to delineate the three-dimensional structures of the pelvis and demonstrate the radiation distribution.  Since that time, the seed localization, and complex isodose planning with dose volume histograms have now been completed.  Results:   Prostate Coverage - The dose of radiation delivered to the 90% or more of the prostate gland (D90) was 120.68% of the prescription dose. This exceeds our goal of greater than 90%. Rectal Sparing - The volume of rectal tissue receiving the prescription dose or higher was 0.0 cc. This falls under our thresholds tolerance of 1.0 cc.  Impression: The prostate seed implant appears to show adequate target coverage and appropriate rectal sparing.  Plan:  The patient will continue to follow with urology for ongoing PSA determinations. I would anticipate a high likelihood for local tumor control with minimal risk for rectal morbidity.  ________________________________  Sheral Apley Tammi Klippel, M.D.

## 2019-02-28 ENCOUNTER — Other Ambulatory Visit: Payer: Self-pay

## 2019-02-28 DIAGNOSIS — Z20822 Contact with and (suspected) exposure to covid-19: Secondary | ICD-10-CM

## 2019-03-02 LAB — NOVEL CORONAVIRUS, NAA: SARS-CoV-2, NAA: NOT DETECTED

## 2019-03-17 ENCOUNTER — Other Ambulatory Visit: Payer: Self-pay

## 2019-03-17 DIAGNOSIS — Z20822 Contact with and (suspected) exposure to covid-19: Secondary | ICD-10-CM

## 2019-03-18 LAB — NOVEL CORONAVIRUS, NAA: SARS-CoV-2, NAA: NOT DETECTED

## 2019-10-03 IMAGING — CR DG CHEST 2V
2 series · 2 of 2 positions shown · non-contrast
Comparison: 04/06/2007

CLINICAL DATA: History of prostate cancer

EXAM:
CHEST - 2 VIEW

[w chest pa]
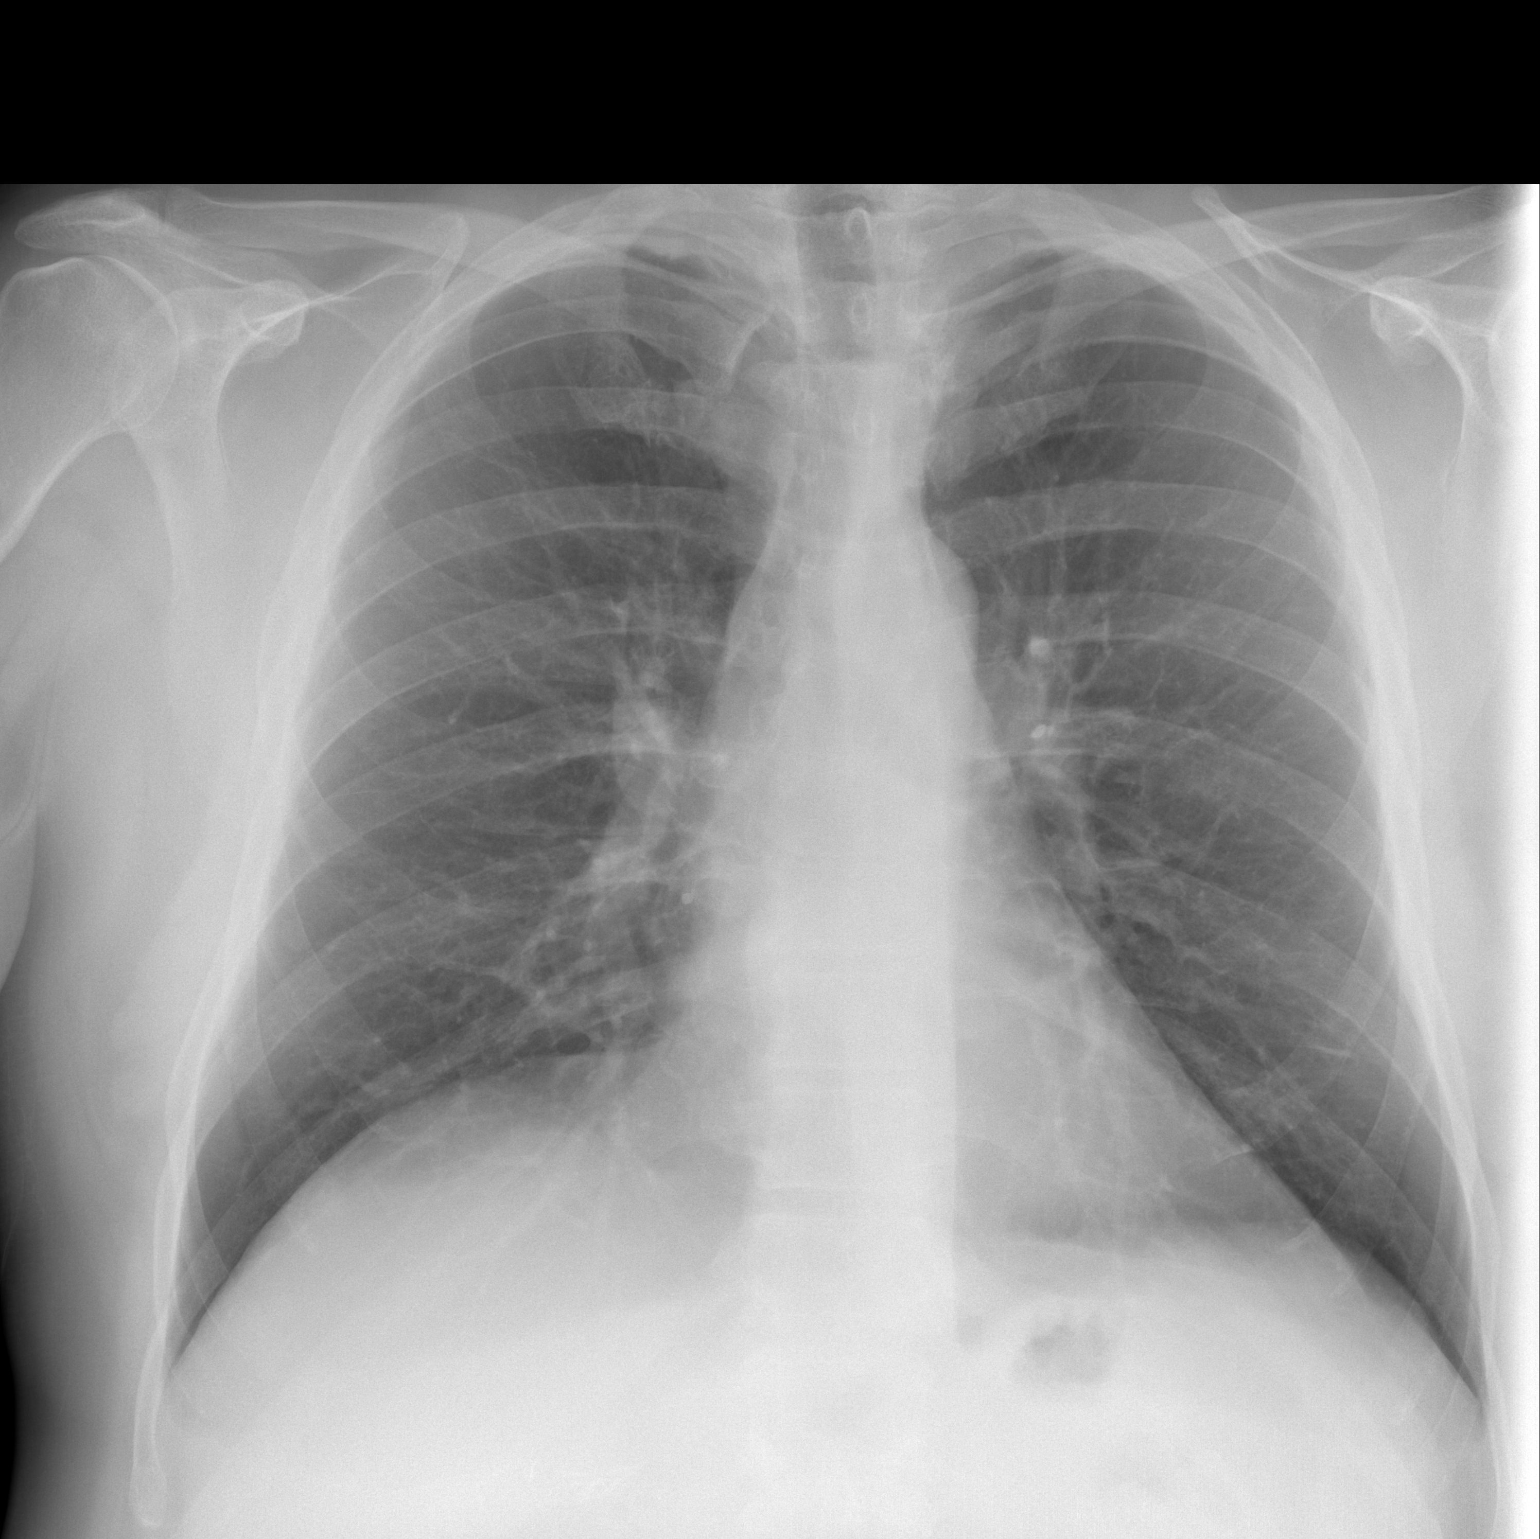

[w chest lat]
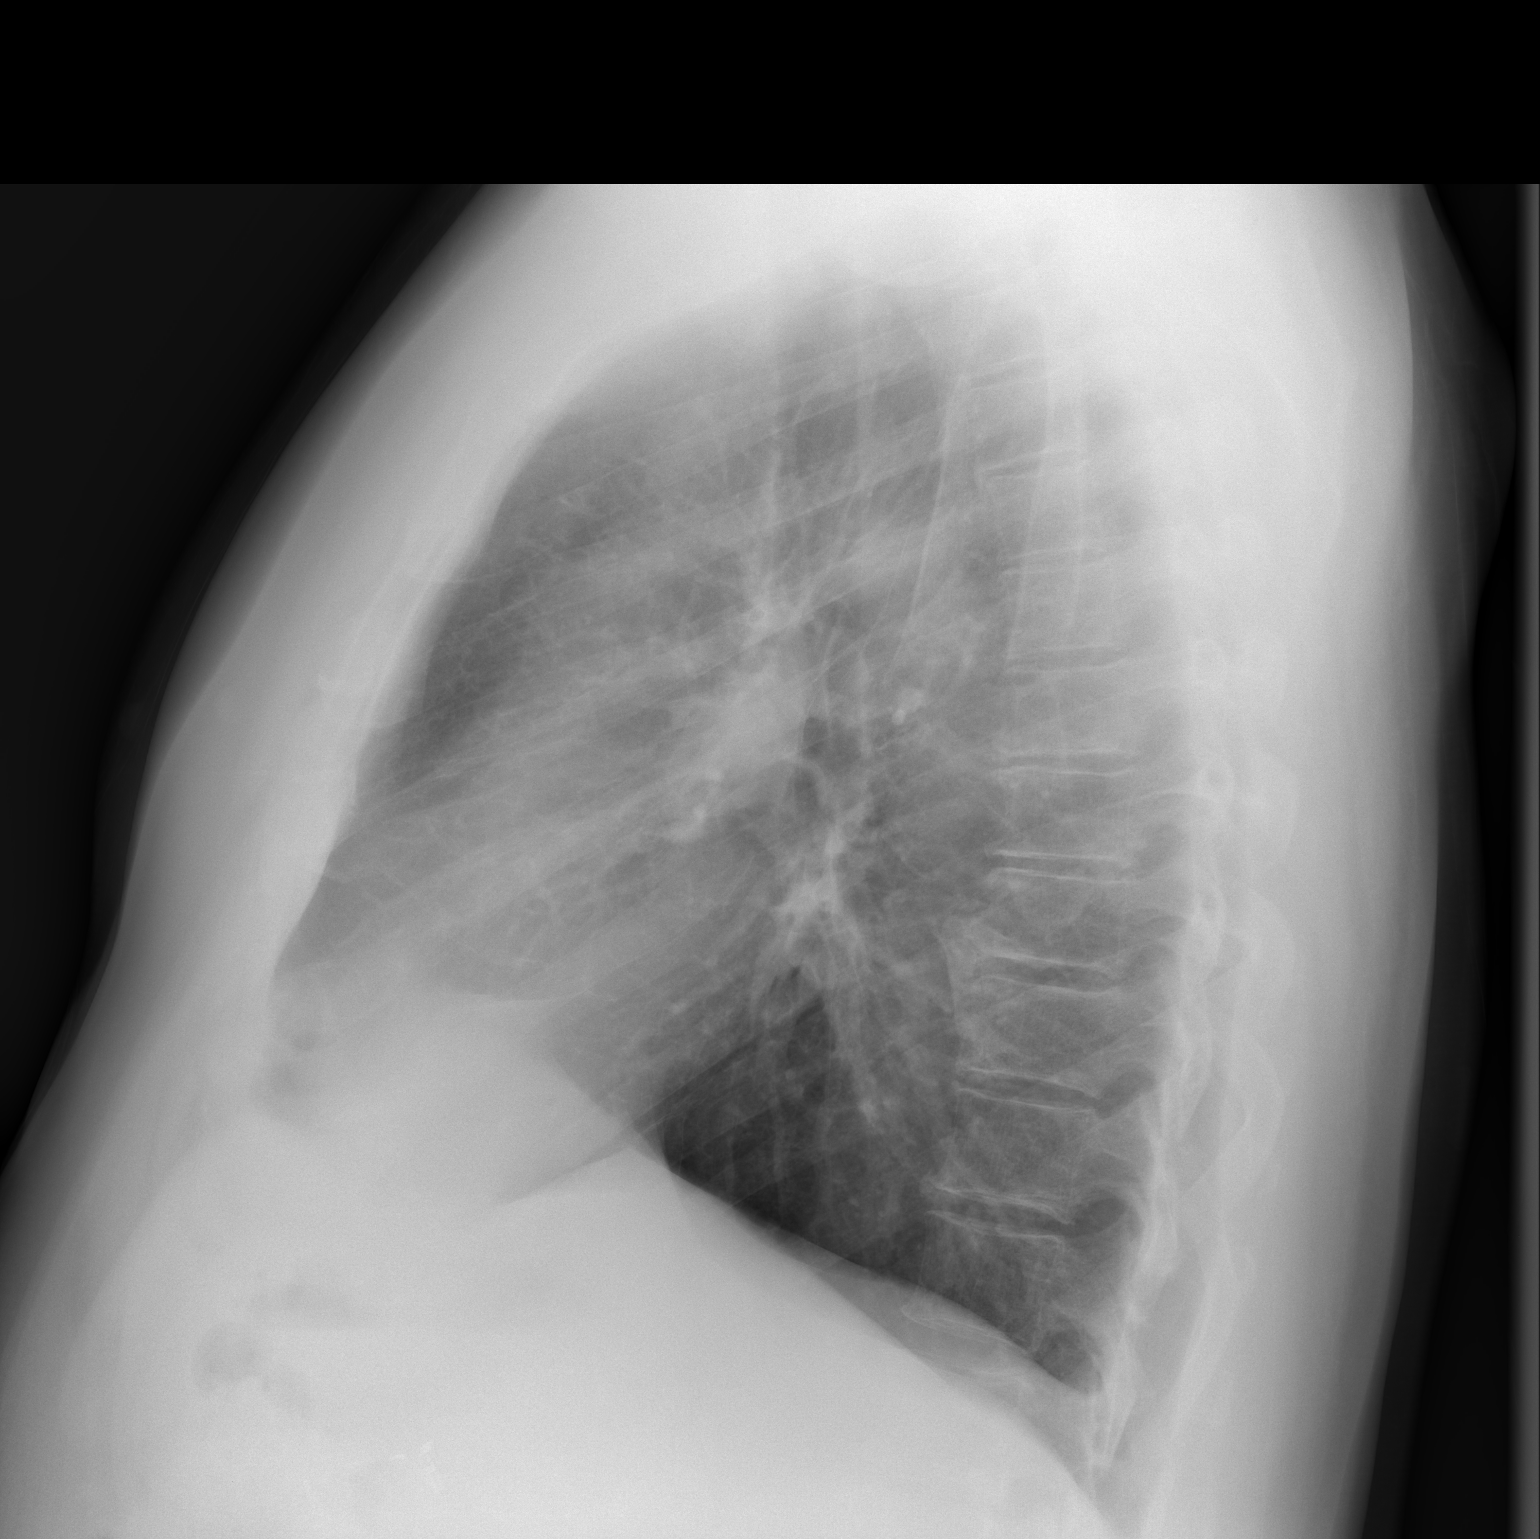

[2 of 2 positions shown; findings below may reference images not displayed]

FINDINGS: The heart size and mediastinal contours are within normal limits.
Both lungs are clear. Degenerative changes of the spine.
IMPRESSION: No active cardiopulmonary disease.

## 2022-12-13 DIAGNOSIS — R079 Chest pain, unspecified: Secondary | ICD-10-CM | POA: Insufficient documentation

## 2022-12-13 NOTE — Progress Notes (Signed)
Cardiology Office Note  Date:  12/15/2022   ID:  [name], DOB Mar 24, 1961, MRN [id number]  PCP:  Darrow Bussing, MD   Chief Complaint  Patient presents with   New Patient (Initial Visit)    Patient c/o chest pressure/discomfort at rest mostly. Medications reviewed by the patient verbally.     HPI:  [name] is a 62 year old gentleman with past medical history of Adenocarcinoma of prostate Anxiety Hyperlipidemia Essential hypertension Former smoker quit 2005 Who presents by referral from Dr. Docia Chuck for consultation of his chest pain  Seen by primary care November 28, 2022, reported having chest pressure Son passed 1/24 On xanax,   No prior lipid panel available  Chest Pain in 2008,  Echocardiogram November 2008, normal study, EF 65 to 70% heart cath: nonobstructive disease Left side pain, at rest Feels fine today 2 days ago with angina, took 1.5 xanax  Family hx:  adopted dad died CAD/mi 67 yo Mother with aorta aneurysm  EKG personally reviewed by myself on todays visit EKG Interpretation Date/Time:  Friday December 15 2022 10:51:26 EDT Ventricular Rate:  68 PR Interval:  192 QRS Duration:  92 QT Interval:  388 QTC Calculation: 412 R Axis:   60  Text Interpretation: Normal sinus rhythm Normal ECG When compared with ECG of 18-Jan-2018 11:53, No significant change was found Confirmed by Julien Nordmann [phone]) on 12/15/2022 10:56:12 AM    PMH:   has a past medical history of Coronary arteriosclerosis, GERD (gastroesophageal reflux disease), Hyperlipidemia, Hypertension, and Prostate cancer [facility]) (urologist-  dr wrenn/  oncologist-- dr Kathrynn Running).  PSH:    Past Surgical History:  Procedure Laterality Date   CARDIAC CATHETERIZATION  03-29-2007   dr Amil Amen @[facility]    luminal irregularites w/ artherslerosis plaquing involving LAD and RCA, no sig., obstruction   CYSTOSCOPY N/A 02/28/2018   Procedure: CYSTOSCOPY;  Surgeon: Bjorn Pippin, MD;  Location: [facility];  Service: Urology;  Laterality: N/A;  NO SEEDS FOUND IN BLADDER PER DR [facility]   LAPAROSCOPIC CHOLECYSTECTOMY  10-03-2007   dr Zachery Dakins @[facility]    PROSTATE BIOPSY  11-12-2017   dr Annabell Howells office   RADIOACTIVE SEED IMPLANT N/A 02/28/2018   Procedure: RADIOACTIVE SEED IMPLANT/BRACHYTHERAPY IMPLANT;  Surgeon: Bjorn Pippin, MD;  Location: [facility];  Service: Urology;  Laterality: N/A;  79 SEEDS IMPLANTED   SPACE OAR INSTILLATION N/A 02/28/2018   Procedure: SPACE OAR INSTILLATION;  Surgeon: Bjorn Pippin, MD;  Location: [facility];  Service: Urology;  Laterality: N/A;    Current Outpatient Medications  Medication Sig Dispense Refill   ALPRAZolam (XANAX) 0.5 MG tablet Take 0.5 mg by mouth at bedtime as needed.     atorvastatin (LIPITOR) 20 MG tablet Take 20 mg by mouth daily.     desvenlafaxine (PRISTIQ) 25 MG 24 hr tablet Take 25 mg by mouth daily.     ibuprofen (ADVIL) 200 MG tablet Take 200 mg by mouth every 4 (four) hours as needed.     metoprolol tartrate (LOPRESSOR) 50 MG tablet Take 50 mg by mouth daily.      omeprazole (PRILOSEC OTC) 20 MG tablet Take 20 mg by mouth daily.     omeprazole (PRILOSEC) 20 MG capsule Take 20 mg by mouth daily.     tamsulosin (FLOMAX) 0.4 MG CAPS capsule Take 0.4 mg by mouth daily.     citalopram (CELEXA) 40 MG tablet Take 40 mg by mouth daily. (Patient not taking: Reported on 12/15/2022)  No current facility-administered medications for this visit.     Allergies:   Sertraline   Social History:  The patient  reports that he quit smoking about 19 years ago. His smoking use included cigarettes. He started smoking about 44 years ago. He has a 37.5 pack-year smoking history. He has never used smokeless tobacco. He reports current alcohol use. He reports that he does not use drugs.   Family History:   family history includes AAA (abdominal aortic aneurysm) in his maternal aunt; Breast cancer in his cousin and cousin;  Hyperlipidemia in his maternal aunt; Hypertension in his maternal aunt; Kidney disease in his mother; Prostate cancer in his maternal uncle. He was adopted.    Review of Systems: Review of Systems  Constitutional: Negative.   HENT: Negative.    Respiratory: Negative.    Cardiovascular: Negative.   Gastrointestinal: Negative.   Musculoskeletal: Negative.   Neurological: Negative.   Psychiatric/Behavioral: Negative.    All other systems reviewed and are negative.   PHYSICAL EXAM: VS:  Ht 6' (1.829 m)   Wt 241 lb 8 oz (109.5 kg)   BMI 32.75 kg/m  , BMI Body mass index is 32.75 kg/m. GEN: Well nourished, well developed, in no acute distress HEENT: normal Neck: no JVD, carotid bruits, or masses Cardiac: RRR; no murmurs, rubs, or gallops,no edema  Respiratory:  clear to auscultation bilaterally, normal work of breathing GI: soft, nontender, nondistended, + BS MS: no deformity or atrophy Skin: warm and dry, no rash Neuro:  Strength and sensation are intact Psych: euthymic mood, full affect   Recent Labs: No results found for requested labs within last 365 days.    Lipid Panel No results found for: "CHOL", "HDL", "LDLCALC", "TRIG"    Wt Readings from Last 3 Encounters:  12/15/22 241 lb 8 oz (109.5 kg)  03/15/18 247 lb 12.8 oz (112.4 kg)  02/28/18 248 lb 4.8 oz (112.6 kg)     ASSESSMENT AND PLAN:  Problem List Items Addressed This Visit     Chest pain of uncertain etiology - Primary   Relevant Orders   EKG 12-Lead   Chest pains/angina Risk factors include smoking, mild coronary disease noted on prior cardiac catheterization in 2008 Most recent lipid panel unavailable Nondiabetic Discussed various options for workup for his anginal symptoms, we have recommended cardiac CTA  Anxiety Loss of his son January 2024 Managed by primary care  Essential hypertension Blood pressure reasonably well-controlled, on metoprolol  GERD On PPI    Total encounter time more  than 60 minutes  Greater than 50% was spent in counseling and coordination of care with the patient  Patient seen in consultation for Dr. Docia Chuck and will be referred back to his office for ongoing care of the issues detailed above  Signed, Dossie Arbour, M.D., Ph.D. [facility] [place], [place] [phone]

## 2022-12-15 ENCOUNTER — Ambulatory Visit: Payer: Managed Care, Other (non HMO) | Attending: Cardiovascular Disease | Admitting: Cardiovascular Disease

## 2022-12-15 ENCOUNTER — Encounter: Payer: Self-pay | Admitting: Cardiovascular Disease

## 2022-12-15 VITALS — BP 130/80 | HR 68 | Ht 72.0 in | Wt 241.5 lb

## 2022-12-15 DIAGNOSIS — R079 Chest pain, unspecified: Secondary | ICD-10-CM | POA: Diagnosis not present

## 2022-12-15 DIAGNOSIS — F419 Anxiety disorder, unspecified: Secondary | ICD-10-CM

## 2022-12-15 DIAGNOSIS — I25118 Atherosclerotic heart disease of native coronary artery with other forms of angina pectoris: Secondary | ICD-10-CM

## 2022-12-15 DIAGNOSIS — I209 Angina pectoris, unspecified: Secondary | ICD-10-CM

## 2022-12-15 NOTE — Patient Instructions (Addendum)
Medication Instructions:  No changes  If you need a refill on your cardiac medications before your next appointment, please call your pharmacy.   Lab work: No new labs needed  Testing/Procedures:   Your cardiac CT will be scheduled at one of the below locations:   [facility] [address] [place], [place] [address] [phone]  OR   [facility] [address] [place], [place] [address] [phone]  If scheduled at [facility] or [facility], please arrive 15 mins early for check-in and test prep.  There is spacious parking and easy access to the radiology department from the [facility] entrance. Please enter here and check-in with the desk attendant.   Please follow these instructions carefully (unless otherwise directed):  On the Night Before the Test: Be sure to Drink plenty of water. Do not consume any caffeinated/decaffeinated beverages or chocolate 12 hours prior to your test. Do not take any antihistamines 12 hours prior to your test.  On the Day of the Test: Drink plenty of water until 1 hour prior to the test. Do not eat any food 1 hour prior to test. You may take your regular medications prior to the test.  Take metoprolol (Lopressor) two hours prior to test. If you take Furosemide/Hydrochlorothiazide/Spironolactone, please HOLD on the morning of the test.      After the Test: Drink plenty of water. After receiving IV contrast, you may experience a mild flushed feeling. This is normal. On occasion, you may experience a mild rash up to 24 hours after the test. This is not dangerous. If this occurs, you can take Benadryl 25 mg and increase your fluid intake. If you experience trouble breathing, this can be serious. If it is severe call 911 IMMEDIATELY. If it is mild, please call our office. If you take any of these medications:  Glipizide/Metformin, Avandament, Glucavance, please do not take 48 hours after completing test unless otherwise instructed.  We will call to schedule your test 2-4 weeks out understanding that some insurance companies will need an authorization prior to the service being performed.   For more information and frequently asked questions, please visit our website : [email or web address]  For non-scheduling related questions, please contact the cardiac imaging nurse navigator should you have any questions/concerns: Cardiac Imaging Nurse Navigators Direct Office Dial: [phone]   For scheduling needs, including cancellations and rescheduling, please call [place], [phone].   Follow-Up: At [facility], you and your health needs are our priority.  As part of our continuing mission to provide you with exceptional heart care, we have created designated Provider Care Teams.  These Care Teams include your primary Cardiologist (physician) and Advanced Practice Providers (APPs -  Physician Assistants and Nurse Practitioners) who all work together to provide you with the care you need, when you need it.  You will need a follow up appointment as needed  Providers on your designated Care Team:   Nicolasa Ducking, NP Eula Listen, PA-C Cadence Fransico Michael, [place]

## 2023-01-10 ENCOUNTER — Telehealth (HOSPITAL_COMMUNITY): Payer: Self-pay | Admitting: *Deleted

## 2023-01-10 NOTE — Telephone Encounter (Signed)
Attempted to call patient regarding upcoming cardiac CT appointment. °Left message on voicemail with name and callback number ° °Merle Prescott RN Navigator Cardiac Imaging °[facility] °[phone] Office °[phone] Cell ° °

## 2023-01-11 ENCOUNTER — Ambulatory Visit: Admission: RE | Admit: 2023-01-11 | Payer: Managed Care, Other (non HMO) | Source: Ambulatory Visit

## 2023-01-18 ENCOUNTER — Ambulatory Visit: Admission: RE | Admit: 2023-01-18 | Payer: Managed Care, Other (non HMO) | Source: Ambulatory Visit

## 2023-04-24 ENCOUNTER — Encounter (HOSPITAL_COMMUNITY): Payer: Self-pay

## 2023-09-18 ENCOUNTER — Other Ambulatory Visit: Payer: Self-pay | Admitting: Cardiovascular Disease

## 2023-09-18 DIAGNOSIS — R079 Chest pain, unspecified: Secondary | ICD-10-CM

## 2023-09-18 DIAGNOSIS — I209 Angina pectoris, unspecified: Secondary | ICD-10-CM

## 2023-09-25 ENCOUNTER — Encounter (HOSPITAL_COMMUNITY): Payer: Self-pay

## 2023-09-27 ENCOUNTER — Ambulatory Visit
Admission: RE | Admit: 2023-09-27 | Discharge: 2023-09-27 | Disposition: A | Discharge status: Home / Self Care | Source: Ambulatory Visit | Attending: Cardiovascular Disease | Admitting: Cardiovascular Disease

## 2023-09-27 DIAGNOSIS — I209 Angina pectoris, unspecified: Secondary | ICD-10-CM | POA: Diagnosis present

## 2023-09-27 DIAGNOSIS — R079 Chest pain, unspecified: Secondary | ICD-10-CM | POA: Insufficient documentation

## 2023-09-27 MED ORDER — DILTIAZEM HCL 25 MG/5ML IV SOLN
10.0000 mg | INTRAVENOUS | Status: DC | PRN
  Filled 2023-09-27: qty 5

## 2023-09-27 MED ORDER — NITROGLYCERIN 0.4 MG SL SUBL
0.8000 mg | SUBLINGUAL_TABLET | Freq: Once | SUBLINGUAL | Status: AC
  Administered 2023-09-27: 0.8 mg via SUBLINGUAL
  Filled 2023-09-27: qty 25

## 2023-09-27 MED ORDER — METOPROLOL TARTRATE 5 MG/5ML IV SOLN
10.0000 mg | Freq: Once | INTRAVENOUS | Status: DC | PRN
  Filled 2023-09-27: qty 10

## 2023-09-27 MED ORDER — IOHEXOL 350 MG/ML SOLN
80.0000 mL | Freq: Once | INTRAVENOUS | Status: AC | PRN
  Administered 2023-09-27: 80 mL via INTRAVENOUS

## 2023-09-27 NOTE — Progress Notes (Signed)
 Patient tolerated CT well. Vital signs stable encourage to drink water throughout day.Reasons explained and verbalized understanding. Ambulated steady gait.

## 2023-09-28 ENCOUNTER — Ambulatory Visit: Payer: Self-pay | Admitting: Cardiovascular Disease
